# Patient Record
Sex: Male | Born: 1965 | Race: White | Hispanic: No | Marital: Married | Smoking: Never smoker
Health system: Southern US, Community
[De-identification: ages and names within clinical notes are randomized; demographics above are authoritative.]

## PROBLEM LIST (undated history)

## (undated) DIAGNOSIS — I2584 Coronary atherosclerosis due to calcified coronary lesion: Secondary | ICD-10-CM

## (undated) DIAGNOSIS — R0789 Other chest pain: Secondary | ICD-10-CM

## (undated) DIAGNOSIS — E559 Vitamin D deficiency, unspecified: Secondary | ICD-10-CM

## (undated) DIAGNOSIS — E785 Hyperlipidemia, unspecified: Secondary | ICD-10-CM

## (undated) DIAGNOSIS — I00 Rheumatic fever without heart involvement: Secondary | ICD-10-CM

## (undated) DIAGNOSIS — R0602 Shortness of breath: Secondary | ICD-10-CM

## (undated) DIAGNOSIS — K649 Unspecified hemorrhoids: Secondary | ICD-10-CM

## (undated) HISTORY — DX: Unspecified hemorrhoids: K64.9

## (undated) HISTORY — DX: Other chest pain: R07.89

## (undated) HISTORY — DX: Vitamin D deficiency, unspecified: E55.9

## (undated) HISTORY — DX: Coronary atherosclerosis due to calcified coronary lesion: I25.84

## (undated) HISTORY — DX: Shortness of breath: R06.02

## (undated) HISTORY — DX: Rheumatic fever without heart involvement: I00

## (undated) HISTORY — PX: URETHRA SURGERY: SHX824

## (undated) HISTORY — DX: Hyperlipidemia, unspecified: E78.5

---

## 1988-01-22 LAB — HM SIGMOIDOSCOPY

## 2004-01-24 LAB — FECAL OCCULT BLOOD, GUAIAC: Fecal Occult Blood: NEGATIVE

## 2010-04-10 ENCOUNTER — Encounter: Payer: Self-pay | Admitting: Family Medicine

## 2010-04-10 DIAGNOSIS — N400 Enlarged prostate without lower urinary tract symptoms: Secondary | ICD-10-CM | POA: Insufficient documentation

## 2010-04-10 DIAGNOSIS — I454 Nonspecific intraventricular block: Secondary | ICD-10-CM | POA: Insufficient documentation

## 2010-04-10 DIAGNOSIS — K649 Unspecified hemorrhoids: Secondary | ICD-10-CM | POA: Insufficient documentation

## 2010-04-10 DIAGNOSIS — I00 Rheumatic fever without heart involvement: Secondary | ICD-10-CM | POA: Insufficient documentation

## 2012-02-26 ENCOUNTER — Emergency Department (HOSPITAL_COMMUNITY): Payer: BC Managed Care – PPO

## 2012-02-26 ENCOUNTER — Observation Stay (HOSPITAL_COMMUNITY): Payer: BC Managed Care – PPO

## 2012-02-26 ENCOUNTER — Observation Stay (HOSPITAL_COMMUNITY)
Admission: EM | Admit: 2012-02-26 | Discharge: 2012-02-26 | Disposition: A | Discharge status: Home / Self Care | Payer: BC Managed Care – PPO | Attending: Emergency Medicine | Admitting: Emergency Medicine

## 2012-02-26 ENCOUNTER — Encounter (HOSPITAL_COMMUNITY): Payer: Self-pay | Admitting: *Deleted

## 2012-02-26 DIAGNOSIS — I454 Nonspecific intraventricular block: Secondary | ICD-10-CM | POA: Insufficient documentation

## 2012-02-26 DIAGNOSIS — I059 Rheumatic mitral valve disease, unspecified: Secondary | ICD-10-CM

## 2012-02-26 DIAGNOSIS — R110 Nausea: Secondary | ICD-10-CM | POA: Insufficient documentation

## 2012-02-26 DIAGNOSIS — R079 Chest pain, unspecified: Principal | ICD-10-CM | POA: Insufficient documentation

## 2012-02-26 DIAGNOSIS — R209 Unspecified disturbances of skin sensation: Secondary | ICD-10-CM | POA: Insufficient documentation

## 2012-02-26 LAB — POCT I-STAT, CHEM 8
Calcium, Ion: 1.25 mmol/L — ABNORMAL HIGH (ref 1.12–1.23)
Creatinine, Ser: 1.1 mg/dL (ref 0.50–1.35)
Glucose, Bld: 98 mg/dL (ref 70–99)
Hemoglobin: 15.3 g/dL (ref 13.0–17.0)
Sodium: 144 mEq/L (ref 135–145)
TCO2: 29 mmol/L (ref 0–100)

## 2012-02-26 LAB — POCT I-STAT TROPONIN I

## 2012-02-26 LAB — CBC
Hemoglobin: 15.2 g/dL (ref 13.0–17.0)
MCH: 31.1 pg (ref 26.0–34.0)
MCHC: 35.2 g/dL (ref 30.0–36.0)
MCV: 88.5 fL (ref 78.0–100.0)

## 2012-02-26 MED ORDER — NITROGLYCERIN 0.4 MG SL SUBL
SUBLINGUAL_TABLET | SUBLINGUAL | Status: AC
  Administered 2012-02-26: 0.4 mg via SUBLINGUAL
  Filled 2012-02-26: qty 25

## 2012-02-26 MED ORDER — NITROGLYCERIN 0.4 MG SL SUBL: 0.4000 mg | SUBLINGUAL_TABLET | SUBLINGUAL | Status: DC | PRN

## 2012-02-26 MED ORDER — NITROGLYCERIN 0.4 MG SL SUBL
0.4000 mg | SUBLINGUAL_TABLET | Freq: Once | SUBLINGUAL | Status: AC
  Administered 2012-02-26: 0.4 mg via SUBLINGUAL

## 2012-02-26 MED ORDER — ONDANSETRON HCL 4 MG/2ML IJ SOLN: 4.0000 mg | Freq: Once | INTRAMUSCULAR | Status: DC

## 2012-02-26 MED ORDER — METOPROLOL TARTRATE 1 MG/ML IV SOLN
INTRAVENOUS | Status: AC
  Administered 2012-02-26: 5 mg via INTRAVENOUS
  Filled 2012-02-26: qty 10

## 2012-02-26 MED ORDER — MORPHINE SULFATE 4 MG/ML IJ SOLN: 4.0000 mg | Freq: Once | INTRAMUSCULAR | Status: DC

## 2012-02-26 MED ORDER — IOHEXOL 350 MG/ML SOLN
80.0000 mL | Freq: Once | INTRAVENOUS | Status: AC | PRN
  Administered 2012-02-26: 80 mL via INTRAVENOUS

## 2012-02-26 MED ORDER — SODIUM CHLORIDE 0.9 % IV SOLN: 20.0000 mL | INTRAVENOUS | Status: DC

## 2012-02-26 MED ORDER — METOPROLOL TARTRATE 1 MG/ML IV SOLN
5.0000 mg | Freq: Once | INTRAVENOUS | Status: AC
  Administered 2012-02-26: 5 mg via INTRAVENOUS

## 2012-02-26 MED ORDER — HYDROMORPHONE HCL PF 1 MG/ML IJ SOLN: 1.0000 mg | INTRAMUSCULAR | Status: DC | PRN

## 2012-02-26 NOTE — ED Notes (Signed)
Pt states that he woke up at 03:30 with CP. Pt states that he was nausated at the time. Pt also complaining of leg heaviness. Pt states pain is left side of chest does not radiate. Pt states feels dull. Pt has had similar pain in past.

## 2012-02-26 NOTE — ED Notes (Signed)
CHECKED WITH DR Ignacia Palma AGAIN ABOUT DISPO ON PT. HE ADVISES HE WILL BE HERE AS SOON AS HE CAN

## 2012-02-26 NOTE — ED Notes (Signed)
MD at bedside. 

## 2012-02-26 NOTE — ED Notes (Signed)
Called ct. They will call when ready.

## 2012-02-26 NOTE — ED Notes (Signed)
Dr Ignacia Palma aware pt has resulted his studies. He will review results and dispo pt.

## 2012-02-26 NOTE — ED Provider Notes (Signed)
History     CSN: [id number]  Arrival date & time 02/26/12  0453   First MD Initiated Contact with Patient 02/26/12 0502      Chief Complaint  Patient presents with  . Chest Pain    (Consider location/radiation/quality/duration/timing/severity/associated sxs/prior treatment) HPI Hx per PT> woke today with L sided CP 5/10, dull in quality like I have been kicked in the chest.  Some nausea no vomiting. No sig SOB, no F/C, no cough, no leg pain or swelling. No h/o same. Has some L arm tingling, no arm pain or radiation of pain, no known aggrevating or alleviating factors.  Past Medical History  Diagnosis Date  . 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 Unspecified hemorrhoids without mention of complication   . Bundle branch block, unspecified   . Hyperplasia of prostate     No past surgical history on file.  No family history on file.  History  Substance Use Topics  . Smoking status: Not on file  . Smokeless tobacco: Not on file  . Alcohol Use: Not on file      Review of Systems  Constitutional: Negative for fever and chills.  HENT: Negative for neck pain and neck stiffness.   Eyes: Negative for pain.  Respiratory: Negative for shortness of breath.   Cardiovascular: Positive for chest pain.  Gastrointestinal: Negative for abdominal pain.  Genitourinary: Negative for dysuria.  Musculoskeletal: Negative for back pain.  Skin: Negative for rash.  Neurological: Negative for headaches.  All other systems reviewed and are negative.    Allergies  Simvastatin and Lipitor  Home Medications   Current Outpatient Rx  Name  Route  Sig  Dispense  Refill  . NIACIN ER (ANTIHYPERLIPIDEMIC) 500 MG PO TBCR   Oral   Take 500 mg by mouth at bedtime.           . OMEGA-3-ACID ETHYL ESTERS 1 G PO CAPS   Oral   Take 1 g by mouth 2 (two) times daily.           Marland Kitchen ROSUVASTATIN CALCIUM 5 MG PO TABS   Oral   Take 5 mg by mouth daily.           Marland Kitchen VARDENAFIL  HCL 10 MG PO TABS   Oral   Take 10 mg by mouth daily as needed.             There were no vitals taken for this visit.  Physical Exam  Constitutional: He is oriented to person, place, and time. He appears well-developed and well-nourished.  HENT:  Head: Normocephalic and atraumatic.  Eyes: Conjunctivae normal and EOM are normal. Pupils are equal, round, and reactive to light.  Neck: Trachea normal. Neck supple. No thyromegaly present.  Cardiovascular: Normal rate, regular rhythm, S1 normal, S2 normal and normal pulses.   Pulses:      Radial pulses are 2+ on the right side, and 2+ on the left side.  Pulmonary/Chest: Effort normal and breath sounds normal. He has no wheezes. He has no rhonchi. He has no rales. He exhibits no tenderness.  Abdominal: Soft. Normal appearance and bowel sounds are normal. There is no tenderness. There is no CVA tenderness and negative Murphy's sign.  Musculoskeletal:       BLE:s Calves nontender, no cords or erythema, negative Homans sign  Neurological: He is alert and oriented to person, place, and time. He has normal strength. No cranial nerve deficit or sensory deficit. GCS eye  subscore is 4. GCS verbal subscore is 5. GCS motor subscore is 6.  Skin: Skin is warm and dry. No rash noted. He is not diaphoretic.  Psychiatric: His speech is normal.       Cooperative and appropriate    ED Course  Procedures (including critical care time)  Results for orders placed during the hospital encounter of 02/26/12  CBC      Component Value Range   WBC 5.1  4.0 - 10.5 K/uL   RBC 4.88  4.22 - 5.81 MIL/uL   Hemoglobin 15.2  13.0 - 17.0 g/dL   HCT 16.1  09.6 - 04.5 %   MCV 88.5  78.0 - 100.0 fL   MCH 31.1  26.0 - 34.0 pg   MCHC 35.2  30.0 - 36.0 g/dL   RDW 40.9  81.1 - 91.4 %   Platelets 144 (*) 150 - 400 K/uL  POCT I-STAT, CHEM 8      Component Value Range   Sodium 144  135 - 145 mEq/L   Potassium 4.3  3.5 - 5.1 mEq/L   Chloride 105  96 - 112 mEq/L   BUN  11  6 - 23 mg/dL   Creatinine, Ser 7.82  0.50 - 1.35 mg/dL   Glucose, Bld 98  70 - 99 mg/dL   Calcium, Ion 9.56 (*) 1.12 - 1.23 mmol/L   TCO2 29  0 - 100 mmol/L   Hemoglobin 15.3  13.0 - 17.0 g/dL   HCT 21.3  08.6 - 57.8 %  POCT I-STAT TROPONIN I      Component Value Range   Troponin i, poc 0.00  0.00 - 0.08 ng/mL   Comment 3            Dg Chest Portable 1 View  02/26/2012  *RADIOLOGY REPORT*  Clinical Data: Left chest pain and shortness of breath.  PORTABLE CHEST - 1 VIEW  Comparison: None.  Findings: Mild hyperinflation. The heart size and pulmonary vascularity are normal. The lungs appear clear and expanded without focal air space disease or consolidation. No blunting of the costophrenic angles. No pneumothorax.  Mediastinal contours appear intact.  IMPRESSION: Mild hyperinflation.  No evidence of active infiltration.   Original Report Authenticated By: Burman Nieves, M.D.      Date: 02/26/2012  Rate: 73  Rhythm: normal sinus rhythm  QRS Axis: normal  Intervals: normal  ST/T Wave abnormalities: nonspecific ST changes  Conduction Disutrbances:none  Narrative Interpretation:   Old EKG Reviewed: none available  ASA PTA NTG - no sig change in pain IV morphine pain control  MDM  CP low risk for ACS - no FH early CAD.   evaluated with ECG, labs and CXR  Plan CDU for coronary CT scan to further evaluate - DR Ignacia Palma to follow      Sunnie Nielsen, MD 02/26/12 2308

## 2012-02-26 NOTE — ED Notes (Signed)
Portable CXR at bedside.

## 2012-02-26 NOTE — Progress Notes (Signed)
  Echocardiogram 2D Echocardiogram has been performed.  Cathie Beams 02/26/2012, 8:47 AM

## 2012-02-26 NOTE — Progress Notes (Signed)
1:10 PM Coronary CT showed 20% stenosis in LAD.  Echocardiogram essentially normal.  Will refer for followup to Dr. Eldridge Dace, cardiologist on call today.  Reassured and released. 1:40 PM Case discussed with Dr. Eldridge Dace, and his office will call him with an appointment time

## 2012-02-26 NOTE — ED Notes (Signed)
Dr Ignacia Palma in to give pt results of his testing. Pt given a [place] sandwich and one to his wife.

## 2012-02-26 NOTE — ED Notes (Signed)
ECHO ORDERED DUE TO PT VERBALIZING CONCERN ABOUT HIS "MURMER" POSSIBLY BEING A CAUSE OF HIS CHEST PAINS.

## 2012-02-26 NOTE — ED Notes (Signed)
BMI IS 26.6

## 2012-02-27 NOTE — Progress Notes (Signed)
UR completed OBS

## 2012-03-07 ENCOUNTER — Other Ambulatory Visit: Payer: Self-pay

## 2012-03-21 DIAGNOSIS — R0789 Other chest pain: Secondary | ICD-10-CM

## 2012-03-21 DIAGNOSIS — R0602 Shortness of breath: Secondary | ICD-10-CM

## 2012-03-21 HISTORY — DX: Shortness of breath: R06.02

## 2012-03-21 HISTORY — DX: Other chest pain: R07.89

## 2012-04-30 ENCOUNTER — Other Ambulatory Visit: Payer: Self-pay | Admitting: Family Medicine

## 2012-05-01 NOTE — Telephone Encounter (Signed)
Patient appointment for visit and lab

## 2012-05-01 NOTE — Telephone Encounter (Signed)
Patient last seen in office on 10-30-11. Last labs done same day. Please advise. Thank you

## 2012-05-04 ENCOUNTER — Encounter: Payer: Self-pay | Admitting: Family Medicine

## 2012-05-04 ENCOUNTER — Ambulatory Visit (INDEPENDENT_AMBULATORY_CARE_PROVIDER_SITE_OTHER): Payer: BC Managed Care – PPO | Admitting: Family Medicine

## 2012-05-04 VITALS — BP 112/66 | HR 62 | Temp 97.2°F | Ht 70.0 in | Wt 187.4 lb

## 2012-05-04 DIAGNOSIS — Z1212 Encounter for screening for malignant neoplasm of rectum: Secondary | ICD-10-CM

## 2012-05-04 DIAGNOSIS — L918 Other hypertrophic disorders of the skin: Secondary | ICD-10-CM

## 2012-05-04 DIAGNOSIS — N529 Male erectile dysfunction, unspecified: Secondary | ICD-10-CM | POA: Insufficient documentation

## 2012-05-04 DIAGNOSIS — Z139 Encounter for screening, unspecified: Secondary | ICD-10-CM | POA: Insufficient documentation

## 2012-05-04 DIAGNOSIS — E785 Hyperlipidemia, unspecified: Secondary | ICD-10-CM

## 2012-05-04 DIAGNOSIS — L919 Hypertrophic disorder of the skin, unspecified: Secondary | ICD-10-CM

## 2012-05-04 LAB — POCT CBC
MCH, POC: 32.2 pg — AB (ref 27–31.2)
MCV: 92.1 fL (ref 80–97)
POC LYMPH PERCENT: 34 %L (ref 10–50)
Platelet Count, POC: 131 10*3/uL — AB (ref 142–424)
RDW, POC: 12.5 %
WBC: 4.5 10*3/uL — AB (ref 4.6–10.2)

## 2012-05-04 LAB — BASIC METABOLIC PANEL WITH GFR
BUN: 18 mg/dL (ref 6–23)
Chloride: 108 mEq/L (ref 96–112)
Creat: 1.14 mg/dL (ref 0.50–1.35)
GFR, Est African American: 87 mL/min
GFR, Est Non African American: 76 mL/min

## 2012-05-04 LAB — HEPATIC FUNCTION PANEL
ALT: 17 U/L (ref 0–53)
Albumin: 4.3 g/dL (ref 3.5–5.2)
Alkaline Phosphatase: 62 U/L (ref 39–117)
Total Protein: 6.5 g/dL (ref 6.0–8.3)

## 2012-05-04 MED ORDER — VARDENAFIL HCL 10 MG PO TBDP: 1.0000 | ORAL_TABLET | Freq: Every day | ORAL | Status: DC | PRN

## 2012-05-04 NOTE — Addendum Note (Signed)
Addended by: Orma Render F on: 05/04/2012 10:59 AM   Modules accepted: Orders

## 2012-05-04 NOTE — Patient Instructions (Addendum)
Continue current meds and therapeutic lifestyle changes Clean axillary area gently, use antibiotic ointment if necessary

## 2012-05-04 NOTE — Addendum Note (Signed)
Addended by: Bearl Mulberry on: 05/04/2012 10:36 AM   Modules accepted: Orders

## 2012-05-04 NOTE — Progress Notes (Signed)
  Subjective:    Patient ID: Kyle Andrade, male    DOB: 07-Oct-1965, 47 y.o.   MRN: [id number]  HPI  Kyle Andrade comes into day for regular followup of his ongoing problems.  As of note he had a visit to the emergency room in February for chest pain. This was worked up with an EKG, echocardiogram, and chest cardiac CT. The workup basically showed minimal atherosclerosis. A chest x-ray was done which was negative. BMP and CBC were done during the visit to the emergency room and these were essentially within normal limits. Health maintenance is up-to-date.  Review of Systems  Constitutional: Negative.   HENT: Negative.   Eyes: Negative.   Respiratory: Negative.   Cardiovascular: Negative.   Gastrointestinal: Negative.   Endocrine: Negative.   Genitourinary: Negative.   Musculoskeletal: Negative.   Allergic/Immunologic: Positive for environmental allergies.  Neurological: Negative.   Psychiatric/Behavioral: Negative.        Objective:   Physical Exam  BP 112/66  Pulse 62  Temp(Src) 97.2 F (36.2 C) (Oral)  Ht 5\' 10"  (1.778 m)  Wt 187 lb 6.4 oz (85.004 kg)  BMI 26.89 kg/m2  The patient appeared well nourished and normally developed, alert and oriented to time and place. Speech, behavior and judgement appear normal. Vital signs as documented.  Head exam is unremarkable. No scleral icterus or pallor noted. Nasal congestion bilaterally left greater than right, throat was normal.  Neck is without jugular venous distension, thyromegally, or carotid bruits. Carotid upstrokes are brisk bilaterally. No cervical adenopathy. Lungs are clear anteriorly and posteriorly to auscultation. Normal respiratory effort. Cardiac exam reveals regular rate and rhythm. First and second heart sounds normal. No murmurs, rubs or gallops.  Abdominal exam reveals normal bowl sounds, no masses, no organomegaly and no aortic enlargement. No inguinal adenopathy. Extremities are nonedematous and both femoral and  pedal pulses are normal. Skin without pallor or jaundice.  Warm and dry, without rash. One skin tag right axilla, probably removed under sterile technique. Neurologic exam reveals normal deep tendon reflexes and normal sensation.         Assessment & Plan:  1. Other and unspecified hyperlipidemia - BASIC METABOLIC PANEL WITH GFR - Hepatic function panel - NMR Lipoprofile with Lipids  2. Screening - POCT CBC - Vitamin D 25 hydroxy  3. Inflamed skin tag Axillary skin tag removed without problem  4. Erectile dysfunction Samples of Cialis 5 were given for the patient to try. Also prescription for staxyn  was called in to [facility] in [place]

## 2012-05-05 LAB — VITAMIN D 25 HYDROXY (VIT D DEFICIENCY, FRACTURES): Vit D, 25-Hydroxy: 47 ng/mL (ref 30–89)

## 2012-05-06 LAB — NMR LIPOPROFILE WITH LIPIDS
HDL Size: 8.7 nm — ABNORMAL LOW (ref >=9.2)
HDL-C: 37 mg/dL — ABNORMAL LOW (ref >=40)
LDL Size: 20.6 nm (ref >20.5)
Large HDL-P: 2.5 umol/L — ABNORMAL LOW (ref >=4.8)
Small LDL Particle Number: 448 nmol/L (ref <=527)

## 2012-11-02 ENCOUNTER — Ambulatory Visit (INDEPENDENT_AMBULATORY_CARE_PROVIDER_SITE_OTHER): Payer: BC Managed Care – PPO | Admitting: Family Medicine

## 2012-11-02 ENCOUNTER — Encounter: Payer: Self-pay | Admitting: Family Medicine

## 2012-11-02 VITALS — BP 114/73 | HR 60 | Temp 97.0°F | Ht 70.0 in | Wt 182.0 lb

## 2012-11-02 DIAGNOSIS — E559 Vitamin D deficiency, unspecified: Secondary | ICD-10-CM

## 2012-11-02 DIAGNOSIS — E785 Hyperlipidemia, unspecified: Secondary | ICD-10-CM

## 2012-11-02 DIAGNOSIS — Z23 Encounter for immunization: Secondary | ICD-10-CM

## 2012-11-02 DIAGNOSIS — Z0000 Encounter for general adult medical examination without abnormal findings: Secondary | ICD-10-CM

## 2012-11-02 DIAGNOSIS — N400 Enlarged prostate without lower urinary tract symptoms: Secondary | ICD-10-CM

## 2012-11-02 LAB — POCT UA - MICROSCOPIC ONLY
Casts, Ur, LPF, POC: NEGATIVE
Mucus, UA: NEGATIVE
WBC, Ur, HPF, POC: NEGATIVE
Yeast, UA: NEGATIVE

## 2012-11-02 LAB — POCT URINALYSIS DIPSTICK
Ketones, UA: NEGATIVE
Protein, UA: NEGATIVE
Spec Grav, UA: 1.01
Urobilinogen, UA: NEGATIVE
pH, UA: 6.5

## 2012-11-02 LAB — POCT CBC
Granulocyte percent: 59.1 %G (ref 37–80)
MPV: 8.2 fL (ref 0–99.8)
POC Granulocyte: 2.5 (ref 2–6.9)
POC LYMPH PERCENT: 37.7 %L (ref 10–50)
Platelet Count, POC: 139 10*3/uL — AB (ref 142–424)
RDW, POC: 11.9 %

## 2012-11-02 NOTE — Progress Notes (Signed)
Subjective:    Patient ID: Kyle Andrade, male    DOB: August 24, 1965, 47 y.o.   MRN: [id number]  HPI Pt here for follow up and management of chronic medical problems. Patient is in today for his annual exam. See previous note as he was in the hospital for a chest pain workup back in February and everything turned out well.     Patient Active Problem List   Diagnosis Date Noted  . Other and unspecified hyperlipidemia 05/04/2012  . Screening 05/04/2012  . Erectile dysfunction 05/04/2012  . 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 Unspecified hemorrhoids without mention of complication   . Bundle branch block, unspecified   . Hyperplasia of prostate    Outpatient Encounter Prescriptions as of 11/02/2012  Medication Sig Dispense Refill  . aspirin 81 MG chewable tablet Chew 81 mg by mouth daily.      . Cholecalciferol (VITAMIN D) 2000 UNITS CAPS Take 1 capsule by mouth daily.      . CRESTOR 10 MG tablet TAKE (1) TABLET DAILY AS DIRECTED.  30 tablet  3  . fluticasone (FLONASE) 50 MCG/ACT nasal spray Place 2 sprays into the nose daily.      . niacin (NIASPAN) 500 MG CR tablet Take 500 mg by mouth at bedtime.        Marland Kitchen omega-3 acid ethyl esters (LOVAZA) 1 G capsule Take 2 g by mouth daily.       . Vardenafil HCl 10 MG TBDP Take 1 tablet by mouth daily as needed.  6 tablet  1   No facility-administered encounter medications on file as of 11/02/2012.    Review of Systems  Constitutional: Negative.   HENT: Negative.   Eyes: Negative.   Respiratory: Negative.   Cardiovascular: Negative.   Gastrointestinal: Negative.   Endocrine: Negative.   Genitourinary: Negative.   Musculoskeletal: Negative.   Skin: Negative.   Allergic/Immunologic: Negative.   Neurological: Negative.   Hematological: Negative.   Psychiatric/Behavioral: Negative.        Objective:   Physical Exam  Nursing note and vitals reviewed. Constitutional: He is oriented to person, place, and time. He  appears well-developed and well-nourished.  HENT:  Head: Normocephalic and atraumatic.  Right Ear: External ear normal.  Left Ear: External ear normal.  Nose: Nose normal.  Mouth/Throat: Oropharynx is clear and moist. No oropharyngeal exudate.  Slight nasal congestion on the left side  Eyes: Conjunctivae and EOM are normal. Pupils are equal, round, and reactive to light. Right eye exhibits no discharge. Left eye exhibits no discharge. No scleral icterus.  Neck: Normal range of motion. Neck supple. No tracheal deviation present. No thyromegaly present.  No bruits in the neck  Cardiovascular: Normal rate, regular rhythm, normal heart sounds and intact distal pulses.  Exam reveals no gallop and no friction rub.   No murmur heard. At 72 per minute  Pulmonary/Chest: Effort normal and breath sounds normal. No respiratory distress. He has no wheezes. He has no rales. He exhibits no tenderness.  Abdominal: Soft. Bowel sounds are normal. He exhibits no mass. There is no tenderness. There is no rebound and no guarding.  Genitourinary: Rectum normal, prostate normal and penis normal.  No rectal masses, no hernia, minimal if any enlargement of the prostate. No prostate nodules  Musculoskeletal: Normal range of motion. He exhibits no edema and no tenderness.  Lymphadenopathy:    He has no cervical adenopathy.  Neurological: He is alert and oriented to person, place,  and time. He has normal reflexes. No cranial nerve deficit.  Skin: Skin is warm and dry. No rash noted. No erythema. No pallor.  Psychiatric: He has a normal mood and affect. His behavior is normal. Judgment and thought content normal.   BP 114/73  Pulse 60  Temp(Src) 97 F (36.1 C) (Oral)  Ht 5\' 10"  (1.778 m)  Wt 182 lb (82.555 kg)  BMI 26.11 kg/m2        Assessment & Plan:   1. Annual physical exam   2. Other and unspecified hyperlipidemia   3. BPH (benign prostatic hyperplasia)   4. Vitamin D deficiency    Orders Placed  This Encounter  Procedures  . Hepatic function panel  . BMP8+EGFR  . NMR, lipoprofile  . PSA, total and free  . Testosterone,Free and Total  . Vit D  25 hydroxy (rtn osteoporosis monitoring)  . POCT CBC  . POCT urinalysis dipstick  . POCT UA - Microscopic Only   Patient Instructions  Continue current medications. Continue good therapeutic lifestyle changes.  Fall precautions discussed with patient. Follow up as planned and earlier as needed.  At age 57 you will need to get a colonoscopy, a Prevnar shot, and a shingle shot. Keep your eye exam every 2 years    Nyra Capes MD

## 2012-11-02 NOTE — Patient Instructions (Addendum)
Continue current medications. Continue good therapeutic lifestyle changes.  Fall precautions discussed with patient. Follow up as planned and earlier as needed.  At age 47 you will need to get a colonoscopy, a Prevnar shot, and a shingle shot. Keep your eye exam every 2 years

## 2012-11-03 LAB — TESTOSTERONE,FREE AND TOTAL: Testosterone: 438 ng/dL (ref 348–1197)

## 2012-11-03 LAB — BMP8+EGFR
BUN/Creatinine Ratio: 13 (ref 9–20)
BUN: 14 mg/dL (ref 6–24)
Creatinine, Ser: 1.07 mg/dL (ref 0.76–1.27)
GFR calc Af Amer: 94 mL/min/{1.73_m2} (ref >59)
Glucose: 94 mg/dL (ref 65–99)

## 2012-11-03 LAB — HEPATIC FUNCTION PANEL
Albumin: 5 g/dL (ref 3.5–5.5)
Total Protein: 6.5 g/dL (ref 6.0–8.5)

## 2012-11-03 LAB — VITAMIN D 25 HYDROXY (VIT D DEFICIENCY, FRACTURES): Vit D, 25-Hydroxy: 54.4 ng/mL (ref 30.0–100.0)

## 2012-11-03 LAB — PSA, TOTAL AND FREE
PSA, Free Pct: 40 %
PSA: 0.7 ng/mL (ref 0.0–4.0)

## 2012-11-03 LAB — NMR, LIPOPROFILE
Cholesterol: 126 mg/dL (ref <200)
HDL Cholesterol by NMR: 38 mg/dL — ABNORMAL LOW (ref >=40)
HDL Particle Number: 27.8 umol/L — ABNORMAL LOW (ref >=30.5)
LDL Particle Number: 929 nmol/L (ref <1000)
LDLC SERPL CALC-MCNC: 69 mg/dL (ref <100)

## 2012-12-11 ENCOUNTER — Encounter: Payer: Self-pay | Admitting: Interventional Cardiology

## 2013-03-30 ENCOUNTER — Ambulatory Visit: Payer: BC Managed Care – PPO | Admitting: Interventional Cardiology

## 2013-04-22 ENCOUNTER — Ambulatory Visit: Payer: BC Managed Care – PPO | Admitting: Interventional Cardiology

## 2013-05-05 ENCOUNTER — Ambulatory Visit (INDEPENDENT_AMBULATORY_CARE_PROVIDER_SITE_OTHER): Payer: BC Managed Care – PPO | Admitting: Family Medicine

## 2013-05-05 ENCOUNTER — Encounter: Payer: Self-pay | Admitting: Family Medicine

## 2013-05-05 VITALS — BP 130/75 | HR 61 | Temp 97.7°F | Ht 70.0 in | Wt 183.0 lb

## 2013-05-05 DIAGNOSIS — N529 Male erectile dysfunction, unspecified: Secondary | ICD-10-CM

## 2013-05-05 DIAGNOSIS — E785 Hyperlipidemia, unspecified: Secondary | ICD-10-CM

## 2013-05-05 DIAGNOSIS — Z1212 Encounter for screening for malignant neoplasm of rectum: Secondary | ICD-10-CM

## 2013-05-05 DIAGNOSIS — E559 Vitamin D deficiency, unspecified: Secondary | ICD-10-CM

## 2013-05-05 LAB — POCT CBC
GRANULOCYTE PERCENT: 66.2 % (ref 37–80)
HCT, POC: 52.7 % (ref 43.5–53.7)
Hemoglobin: 17.1 g/dL (ref 14.1–18.1)
LYMPH, POC: 1.2 (ref 0.6–3.4)
MCH, POC: 30.2 pg (ref 27–31.2)
MCHC: 32.4 g/dL (ref 31.8–35.4)
MCV: 93.1 fL (ref 80–97)
MPV: 8.3 fL (ref 0–99.8)
PLATELET COUNT, POC: 90 10*3/uL — AB (ref 142–424)
POC GRANULOCYTE: 2.4 (ref 2–6.9)
POC LYMPH PERCENT: 32 %L (ref 10–50)
RBC: 5.7 M/uL (ref 4.69–6.13)
RDW, POC: 12.2 %
WBC: 3.6 10*3/uL — AB (ref 4.6–10.2)

## 2013-05-05 MED ORDER — CRESTOR 10 MG PO TABS: ORAL_TABLET | ORAL | Status: DC

## 2013-05-05 NOTE — Patient Instructions (Addendum)
Continue current medications. Continue good therapeutic lifestyle changes which include good diet and exercise. Fall precautions discussed with patient. If an FOBT was given today- please return it to our front desk. Be sure and check with your insurance regarding the shingles vaccine and Prevnar vaccine which is recommended at age 50 Check your body closely for tics Drink plenty of fluids Return the FOBT We will call you with the results of the lab work once those results are available

## 2013-05-05 NOTE — Progress Notes (Signed)
 Subjective:    Patient ID: Kyle Andrade, male    DOB: 12/21/1965, 47 y.o.   MRN: [id number]  HPI Pt here for follow up and management of chronic medical problems. The patient has been doing well with no particular complaints or problems. Has a history of hyperlipidemia and erectile dysfunction. His family history is stable his father has COPD because of a history of smoking.       Patient Active Problem List   Diagnosis Date Noted  . Hyperlipidemia 05/04/2012  . Screening 05/04/2012  . Erectile dysfunction 05/04/2012  . 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 Unspecified hemorrhoids without mention of complication   . Bundle branch block, unspecified   . Hyperplasia of prostate    Outpatient Encounter Prescriptions as of 05/05/2013  Medication Sig  . aspirin 81 MG chewable tablet Chew 81 mg by mouth daily.  . Azelastine HCl 0.15 % SOLN Place 1 spray into the nose at bedtime.   . Cholecalciferol (VITAMIN D) 2000 UNITS CAPS Take 1 capsule by mouth daily.  . CRESTOR 10 MG tablet TAKE (1) TABLET DAILY AS DIRECTED.  . fluticasone (FLONASE) 50 MCG/ACT nasal spray Place 2 sprays into the nose daily.  . niacin (NIASPAN) 500 MG CR tablet Take 500 mg by mouth at bedtime.    . omega-3 acid ethyl esters (LOVAZA) 1 G capsule Take 2 g by mouth daily.   . Vardenafil HCl 10 MG TBDP Take 1 tablet by mouth daily as needed.    Review of Systems  Constitutional: Negative.   HENT: Negative.   Eyes: Negative.   Respiratory: Negative.   Cardiovascular: Negative.   Gastrointestinal: Negative.   Endocrine: Negative.   Genitourinary: Negative.   Musculoskeletal: Negative.   Skin: Negative.   Allergic/Immunologic: Negative.   Neurological: Negative.   Hematological: Negative.   Psychiatric/Behavioral: Negative.        Objective:   Physical Exam  Nursing note and vitals reviewed. Constitutional: He is oriented to person, place, and time. He appears well-developed and  well-nourished. No distress.  HENT:  Head: Normocephalic and atraumatic.  Right Ear: External ear normal.  Left Ear: External ear normal.  Nose: Nose normal.  Mouth/Throat: Oropharynx is clear and moist. No oropharyngeal exudate.  Minimal nasal congestion  Eyes: Conjunctivae and EOM are normal. Pupils are equal, round, and reactive to light. Right eye exhibits no discharge. Left eye exhibits no discharge. No scleral icterus.  Neck: Normal range of motion. Neck supple. No thyromegaly present.  No carotid bruits  Cardiovascular: Normal rate, regular rhythm, normal heart sounds and intact distal pulses.   No murmur heard. At 72 per minute  Pulmonary/Chest: Effort normal and breath sounds normal. No respiratory distress. He has no wheezes. He has no rales.  No axillary adenopathy  Abdominal: Soft. Bowel sounds are normal. He exhibits no mass. There is no tenderness. There is no rebound and no guarding.  No inguinal adenopathy  Genitourinary: Prostate normal.  Musculoskeletal: Normal range of motion. He exhibits no edema.  Lymphadenopathy:    He has no cervical adenopathy.  Neurological: He is alert and oriented to person, place, and time. He has normal reflexes.  Skin: Skin is warm and dry. No rash noted.  Psychiatric: He has a normal mood and affect. His behavior is normal. Judgment and thought content normal.   BP 130/75  Pulse 61  Temp(Src) 97.7 F (36.5 C) (Oral)  Ht 5' 10" (1.778 m)  Wt 183 lb (83.008   kg)  BMI 26.26 kg/m2        Assessment & Plan:  1. Hyperlipidemia - POCT CBC - BMP8+EGFR - Hepatic function panel - NMR, lipoprofile  2. Vitamin D deficiency - POCT CBC - Vit D  25 hydroxy (rtn osteoporosis monitoring)  3. Erectile dysfunction  4. Screening for malignant neoplasm of the rectum  5. Allergic rhinitis -Continue Nasacort and Astelin  Meds ordered this encounter  Medications  . Azelastine HCl 0.15 % SOLN    Sig: Place 1 spray into the nose at  bedtime.   . CRESTOR 10 MG tablet    Sig: TAKE (1) TABLET DAILY AS DIRECTED.    Dispense:  90 tablet    Refill:  0      Patient Instructions  Continue current medications. Continue good therapeutic lifestyle changes which include good diet and exercise. Fall precautions discussed with patient. If an FOBT was given today- please return it to our front desk. Be sure and check with your insurance regarding the shingles vaccine and Prevnar vaccine which is recommended at age 72 Check your body closely for tics Drink plenty of fluids Return the FOBT We will call you with the results of the lab work once those results are available   Arrie Senate MD

## 2013-05-06 LAB — NMR, LIPOPROFILE
CHOLESTEROL: 135 mg/dL (ref <200)
HDL CHOLESTEROL BY NMR: 41 mg/dL (ref >=40)
HDL PARTICLE NUMBER: 27.9 umol/L — AB (ref >=30.5)
LDL Particle Number: 1018 nmol/L — ABNORMAL HIGH (ref <1000)
LDL Size: 20.8 nm (ref >20.5)
LDLC SERPL CALC-MCNC: 80 mg/dL (ref <100)
LP-IR Score: 47 — ABNORMAL HIGH (ref <=45)
Small LDL Particle Number: 413 nmol/L (ref <=527)
Triglycerides by NMR: 68 mg/dL (ref <150)

## 2013-05-06 LAB — HEPATIC FUNCTION PANEL
ALBUMIN: 4.5 g/dL (ref 3.5–5.5)
ALT: 21 IU/L (ref 0–44)
AST: 9 IU/L (ref 0–40)
Alkaline Phosphatase: 61 IU/L (ref 39–117)
Bilirubin, Direct: 0.12 mg/dL (ref 0.00–0.40)
TOTAL PROTEIN: 6.6 g/dL (ref 6.0–8.5)
Total Bilirubin: 0.4 mg/dL (ref 0.0–1.2)

## 2013-05-06 LAB — BMP8+EGFR
BUN/Creatinine Ratio: 12 (ref 9–20)
BUN: 13 mg/dL (ref 6–24)
CHLORIDE: 105 mmol/L (ref 97–108)
CO2: 25 mmol/L (ref 18–29)
Calcium: 9.3 mg/dL (ref 8.7–10.2)
Creatinine, Ser: 1.08 mg/dL (ref 0.76–1.27)
GFR calc Af Amer: 93 mL/min/{1.73_m2} (ref >59)
GFR calc non Af Amer: 80 mL/min/{1.73_m2} (ref >59)
GLUCOSE: 88 mg/dL (ref 65–99)
Potassium: 4.2 mmol/L (ref 3.5–5.2)
Sodium: 144 mmol/L (ref 134–144)

## 2013-05-06 LAB — VITAMIN D 25 HYDROXY (VIT D DEFICIENCY, FRACTURES): VIT D 25 HYDROXY: 48.2 ng/mL (ref 30.0–100.0)

## 2013-05-14 ENCOUNTER — Other Ambulatory Visit: Payer: Self-pay | Admitting: Family Medicine

## 2013-10-18 ENCOUNTER — Other Ambulatory Visit: Payer: Self-pay | Admitting: Family Medicine

## 2013-11-04 ENCOUNTER — Ambulatory Visit: Payer: BC Managed Care – PPO | Admitting: Family Medicine

## 2013-11-05 ENCOUNTER — Other Ambulatory Visit: Payer: Self-pay

## 2013-11-08 ENCOUNTER — Ambulatory Visit (INDEPENDENT_AMBULATORY_CARE_PROVIDER_SITE_OTHER): Payer: BC Managed Care – PPO | Admitting: Family Medicine

## 2013-11-08 ENCOUNTER — Encounter: Payer: Self-pay | Admitting: Family Medicine

## 2013-11-08 VITALS — BP 114/78 | HR 67 | Temp 97.3°F | Ht 70.0 in | Wt 187.0 lb

## 2013-11-08 DIAGNOSIS — J301 Allergic rhinitis due to pollen: Secondary | ICD-10-CM

## 2013-11-08 DIAGNOSIS — N400 Enlarged prostate without lower urinary tract symptoms: Secondary | ICD-10-CM

## 2013-11-08 DIAGNOSIS — Z23 Encounter for immunization: Secondary | ICD-10-CM

## 2013-11-08 DIAGNOSIS — K4090 Unilateral inguinal hernia, without obstruction or gangrene, not specified as recurrent: Secondary | ICD-10-CM

## 2013-11-08 DIAGNOSIS — Z0000 Encounter for general adult medical examination without abnormal findings: Secondary | ICD-10-CM

## 2013-11-08 DIAGNOSIS — Z1211 Encounter for screening for malignant neoplasm of colon: Secondary | ICD-10-CM

## 2013-11-08 DIAGNOSIS — E785 Hyperlipidemia, unspecified: Secondary | ICD-10-CM

## 2013-11-08 DIAGNOSIS — K640 First degree hemorrhoids: Secondary | ICD-10-CM | POA: Insufficient documentation

## 2013-11-08 DIAGNOSIS — N5201 Erectile dysfunction due to arterial insufficiency: Secondary | ICD-10-CM

## 2013-11-08 DIAGNOSIS — J309 Allergic rhinitis, unspecified: Secondary | ICD-10-CM | POA: Insufficient documentation

## 2013-11-08 HISTORY — DX: Unilateral inguinal hernia, without obstruction or gangrene, not specified as recurrent: K40.90

## 2013-11-08 LAB — POCT URINALYSIS DIPSTICK
BILIRUBIN UA: NEGATIVE
GLUCOSE UA: NEGATIVE
Ketones, UA: NEGATIVE
LEUKOCYTES UA: NEGATIVE
NITRITE UA: NEGATIVE
Protein, UA: NEGATIVE
RBC UA: NEGATIVE
Spec Grav, UA: <=1.005
UROBILINOGEN UA: NEGATIVE
pH, UA: 7.5

## 2013-11-08 LAB — POCT UA - MICROSCOPIC ONLY
Bacteria, U Microscopic: NEGATIVE
Casts, Ur, LPF, POC: NEGATIVE
Crystals, Ur, HPF, POC: NEGATIVE
MUCUS UA: NEGATIVE
RBC, urine, microscopic: NEGATIVE
YEAST UA: NEGATIVE

## 2013-11-08 LAB — POCT CBC
Granulocyte percent: 62 %G (ref 37–80)
HCT, POC: 45.9 % (ref 43.5–53.7)
HEMOGLOBIN: 14.9 g/dL (ref 14.1–18.1)
LYMPH, POC: 1.4 (ref 0.6–3.4)
MCH, POC: 29.9 pg (ref 27–31.2)
MCHC: 32.5 g/dL (ref 31.8–35.4)
MCV: 92 fL (ref 80–97)
MPV: 8.1 fL (ref 0–99.8)
PLATELET COUNT, POC: 148 10*3/uL (ref 142–424)
POC GRANULOCYTE: 2.4 (ref 2–6.9)
POC LYMPH %: 35.5 % (ref 10–50)
RBC: 5 M/uL (ref 4.69–6.13)
RDW, POC: 12.3 %
WBC: 3.9 10*3/uL — AB (ref 4.6–10.2)

## 2013-11-08 NOTE — Patient Instructions (Addendum)
Continue current medications. Continue good therapeutic lifestyle changes which include good diet and exercise. Fall precautions discussed with patient. If an FOBT was given today- please return it to our front desk. If you are over 50 years old - you may need Prevnar 13 or the adult Pneumonia vaccine.  Flu Shots will be available at our office starting mid- September. Please call and schedule a FLU CLINIC APPOINTMENT.   Continue to use Cialis as directed Continue to use nasal sprays and saline nose spray Continue to drink plenty of fluids Check with your insurance as noted above regarding the Prevnar vaccine and the shingles shot We will arrange for you to get a colonoscopy

## 2013-11-08 NOTE — Progress Notes (Signed)
Subjective:    Patient ID: Kyle Andrade, male    DOB: 22-Aug-1965, 48 y.o.   MRN: [id number]  HPI Patient is here today for annual wellness exam and follow up of chronic medical problems. The patient is having no specific complaints. He continued to a Prevnar vaccine and shingles shot. He is also due to get his colonoscopy. The patient continues to take Cialis and this helps with his voiding habits as well as with erectile dysfunction .         Patient Active Problem List   Diagnosis Date Noted  . Hyperlipidemia 05/04/2012  . Screening 05/04/2012  . Erectile dysfunction 05/04/2012  . 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 Unspecified hemorrhoids without mention of complication   . Bundle branch block, unspecified   . BPH (benign prostatic hyperplasia)    Outpatient Encounter Prescriptions as of 11/08/2013  Medication Sig  . aspirin 81 MG chewable tablet Chew 81 mg by mouth daily.  . Azelastine HCl 0.15 % SOLN USE 1 SPRAYS INTO EACH NOSTRIL AS NEEDED FOR NASAL CONGESTION  . Cholecalciferol (VITAMIN D) 2000 UNITS CAPS Take 1 capsule by mouth daily.  . CRESTOR 10 MG tablet TAKE (1) TABLET DAILY AS DIRECTED.  . fluticasone (FLONASE) 50 MCG/ACT nasal spray 1 SPRAY IN EACH NOSTRIL ONCE A DAY  . niacin (NIASPAN) 500 MG CR tablet TAKE 4 TABLETS ONCE DAILY AS DIRECTED  . omega-3 acid ethyl esters (LOVAZA) 1 G capsule Take 2 g by mouth daily.   . Vardenafil HCl 10 MG TBDP Take 1 tablet by mouth daily as needed.    Review of Systems  Constitutional: Negative.   HENT: Negative.   Eyes: Negative.   Respiratory: Negative.   Cardiovascular: Negative.   Gastrointestinal: Negative.   Endocrine: Negative.   Genitourinary: Negative.   Musculoskeletal: Negative.   Skin: Negative.   Allergic/Immunologic: Negative.   Neurological: Negative.   Hematological: Negative.   Psychiatric/Behavioral: Negative.        Objective:   Physical Exam  Nursing note and vitals  reviewed. Constitutional: He is oriented to person, place, and time. He appears well-developed and well-nourished. No distress.  HENT:  Head: Normocephalic and atraumatic.  Right Ear: External ear normal.  Left Ear: External ear normal.  Mouth/Throat: Oropharynx is clear and moist. No oropharyngeal exudate.  Minimal nasal congestion left greater than right  Eyes: Conjunctivae and EOM are normal. Pupils are equal, round, and reactive to light. Right eye exhibits no discharge. Left eye exhibits no discharge. No scleral icterus.  Neck: Normal range of motion. Neck supple. No thyromegaly present.  No anterior cervical adenopathy or carotid bruits  Cardiovascular: Normal rate, regular rhythm, normal heart sounds and intact distal pulses.  Exam reveals no gallop and no friction rub.   No murmur heard. At 60 per minute  Pulmonary/Chest: Effort normal and breath sounds normal. No respiratory distress. He has no wheezes. He has no rales. He exhibits no tenderness.  Clear anteriorly and posteriorly and with no axillary adenopathy or chest wall masses  Abdominal: Soft. Bowel sounds are normal. He exhibits no mass. There is no tenderness. There is no rebound and no guarding.  No inguinal nodes.  Genitourinary: Rectum normal and penis normal.  The prostate was minimally enlarged without lumps or masses. There were some external hemorrhoids which are not inflamed. There were no rectal masses. A small inguinal hernia was noted on the right side. The external genitalia were normal and the testicles  were normal  Musculoskeletal: Normal range of motion. He exhibits no edema and no tenderness.  Lymphadenopathy:    He has no cervical adenopathy.  Neurological: He is alert and oriented to person, place, and time. He has normal reflexes. No cranial nerve deficit.  Skin: Skin is warm and dry. No rash noted. No erythema. No pallor.  Psychiatric: He has a normal mood and affect. His behavior is normal. Judgment and  thought content normal.    BP 114/78  Pulse 67  Temp(Src) 97.3 F (36.3 C) (Oral)  Ht _0  (1.778 m)  Wt 187 lb (84.823 kg)  BMI 26.83 kg/m2       Assessment & Plan:  1. Hyperlipidemia - POCT CBC - BMP8+EGFR - Hepatic function panel - NMR, lipoprofile  2. BPH (benign prostatic hyperplasia) - POCT CBC - POCT UA - Microscopic Only - POCT urinalysis dipstick - PSA, total and free - Urine culture  3. Special screening for malignant neoplasms, colon - Ambulatory referral to Gastroenterology - POCT CBC  4. Annual physical exam - POCT CBC - POCT UA - Microscopic Only - POCT urinalysis dipstick - BMP8+EGFR - Hepatic function panel - PSA, total and free - NMR, lipoprofile - Vit D  25 hydroxy (rtn osteoporosis monitoring) - Urine culture  5. Allergic rhinitis due to pollen  6. Erectile dysfunction due to arterial insufficiency  7. First degree hemorrhoids  8. Right inguinal hernia -We will continue to monitor this.  No orders of the defined types were placed in this encounter.   Patient Instructions  Continue current medications. Continue good therapeutic lifestyle changes which include good diet and exercise. Fall precautions discussed with patient. If an FOBT was given today- please return it to our front desk. If you are over 82 years old - you may need Prevnar 71 or the adult Pneumonia vaccine.  Flu Shots will be available at our office starting mid- September. Please call and schedule a FLU CLINIC APPOINTMENT.   Continue to use Cialis as directed Continue to use nasal sprays and saline nose spray Continue to drink plenty of fluids Check with your insurance as noted above regarding the Prevnar vaccine and the shingles shot We will arrange for you to get a colonoscopy    Arrie Senate MD

## 2013-11-09 ENCOUNTER — Encounter: Payer: Self-pay | Admitting: Internal Medicine

## 2013-11-09 LAB — HEPATIC FUNCTION PANEL
ALBUMIN: 4.5 g/dL (ref 3.5–5.5)
ALT: 21 IU/L (ref 0–44)
AST: 8 IU/L (ref 0–40)
Alkaline Phosphatase: 67 IU/L (ref 39–117)
BILIRUBIN TOTAL: 0.9 mg/dL (ref 0.0–1.2)
Bilirubin, Direct: 0.19 mg/dL (ref 0.00–0.40)
TOTAL PROTEIN: 6.3 g/dL (ref 6.0–8.5)

## 2013-11-09 LAB — PSA, TOTAL AND FREE
PSA FREE PCT: 37.1 %
PSA FREE: 0.26 ng/mL
PSA: 0.7 ng/mL (ref 0.0–4.0)

## 2013-11-09 LAB — BMP8+EGFR
BUN/Creatinine Ratio: 11 (ref 9–20)
BUN: 13 mg/dL (ref 6–24)
CALCIUM: 9.5 mg/dL (ref 8.7–10.2)
CHLORIDE: 103 mmol/L (ref 97–108)
CO2: 25 mmol/L (ref 18–29)
CREATININE: 1.19 mg/dL (ref 0.76–1.27)
GFR calc Af Amer: 82 mL/min/{1.73_m2} (ref >59)
GFR calc non Af Amer: 71 mL/min/{1.73_m2} (ref >59)
GLUCOSE: 94 mg/dL (ref 65–99)
Potassium: 5.3 mmol/L — ABNORMAL HIGH (ref 3.5–5.2)
Sodium: 143 mmol/L (ref 134–144)

## 2013-11-09 LAB — NMR, LIPOPROFILE
Cholesterol: 133 mg/dL (ref 100–199)
HDL CHOLESTEROL BY NMR: 44 mg/dL (ref >39)
HDL Particle Number: 28.9 umol/L — ABNORMAL LOW (ref >=30.5)
LDL PARTICLE NUMBER: 933 nmol/L (ref <1000)
LDL SIZE: 20.6 nm (ref >20.5)
LDLC SERPL CALC-MCNC: 77 mg/dL (ref 0–99)
LP-IR Score: 33 (ref <=45)
SMALL LDL PARTICLE NUMBER: 416 nmol/L (ref <=527)
TRIGLYCERIDES BY NMR: 61 mg/dL (ref 0–149)

## 2013-11-09 LAB — VITAMIN D 25 HYDROXY (VIT D DEFICIENCY, FRACTURES): VIT D 25 HYDROXY: 54.3 ng/mL (ref 30.0–100.0)

## 2013-11-09 LAB — URINE CULTURE: ORGANISM ID, BACTERIA: NO GROWTH

## 2013-11-10 ENCOUNTER — Telehealth: Payer: Self-pay | Admitting: *Deleted

## 2013-11-10 NOTE — Telephone Encounter (Signed)
Message copied by Almeta MonasSTONE, Eather Chaires M on Wed Nov 10, 2013  2:49 PM ------      Message from: Ernestina PennaMOORE, DONALD W      Created: Wed Nov 10, 2013  7:12 AM       A urine culture done on this patient was negative for bacterial growth ------

## 2013-11-10 NOTE — Telephone Encounter (Signed)
Aware of results. 

## 2013-12-20 ENCOUNTER — Other Ambulatory Visit: Payer: Self-pay | Admitting: Family Medicine

## 2013-12-20 NOTE — Telephone Encounter (Signed)
Refilled per protocol, seen in Oct

## 2013-12-23 ENCOUNTER — Ambulatory Visit (AMBULATORY_SURGERY_CENTER): Payer: Self-pay | Admitting: *Deleted

## 2013-12-23 VITALS — Ht 70.0 in | Wt 190.8 lb

## 2013-12-23 DIAGNOSIS — Z1211 Encounter for screening for malignant neoplasm of colon: Secondary | ICD-10-CM

## 2013-12-23 MED ORDER — MOVIPREP 100 G PO SOLR: ORAL | Status: DC

## 2013-12-23 NOTE — Progress Notes (Signed)
No allergies to eggs or soy. No problems with anesthesia.  Pt given Emmi instructions for colonoscopy  No oxygen use  No diet drug use  

## 2014-01-05 ENCOUNTER — Encounter: Payer: BC Managed Care – PPO | Admitting: Internal Medicine

## 2014-01-10 ENCOUNTER — Ambulatory Visit (AMBULATORY_SURGERY_CENTER): Payer: BC Managed Care – PPO | Admitting: Internal Medicine

## 2014-01-10 ENCOUNTER — Encounter: Payer: Self-pay | Admitting: Internal Medicine

## 2014-01-10 VITALS — BP 107/75 | HR 61 | Temp 96.2°F | Resp 16 | Ht 70.0 in | Wt 190.0 lb

## 2014-01-10 DIAGNOSIS — Z1211 Encounter for screening for malignant neoplasm of colon: Secondary | ICD-10-CM

## 2014-01-10 MED ORDER — SODIUM CHLORIDE 0.9 % IV SOLN: 500.0000 mL | INTRAVENOUS | Status: DC

## 2014-01-10 NOTE — Patient Instructions (Signed)
YOU HAD AN ENDOSCOPIC PROCEDURE TODAY AT THE [facility]: Refer to the procedure report that was given to you for any specific questions about what was found during the examination.  If the procedure report does not answer your questions, please call your gastroenterologist to clarify.  If you requested that your care partner not be given the details of your procedure findings, then the procedure report has been included in a sealed envelope for you to review at your convenience later.  YOU SHOULD EXPECT: Some feelings of bloating in the abdomen. Passage of more gas than usual.  Walking can help get rid of the air that was put into your GI tract during the procedure and reduce the bloating. If you had a lower endoscopy (such as a colonoscopy or flexible sigmoidoscopy) you may notice spotting of blood in your stool or on the toilet paper. If you underwent a bowel prep for your procedure, then you may not have a normal bowel movement for a few days.  DIET: Your first meal following the procedure should be a light meal and then it is ok to progress to your normal diet.  A half-sandwich or bowl of soup is an example of a good first meal.  Heavy or fried foods are harder to digest and may make you feel nauseous or bloated.  Likewise meals heavy in dairy and vegetables can cause extra gas to form and this can also increase the bloating.  Drink plenty of fluids but you should avoid alcoholic beverages for 24 hours.  ACTIVITY: Your care partner should take you home directly after the procedure.  You should plan to take it easy, moving slowly for the rest of the day.  You can resume normal activity the day after the procedure however you should NOT DRIVE or use heavy machinery for 24 hours (because of the sedation medicines used during the test).    SYMPTOMS TO REPORT IMMEDIATELY: A gastroenterologist can be reached at any hour.  During normal business hours, 8:30 AM to 5:00 PM Monday through Friday,  call [phone].  After hours and on weekends, please call the GI answering service at [phone] who will take a message and have the physician on call contact you.   Following lower endoscopy (colonoscopy or flexible sigmoidoscopy):  Excessive amounts of blood in the stool  Significant tenderness or worsening of abdominal pains  Swelling of the abdomen that is new, acute  Fever of 100F or higher    FOLLOW UP: If any biopsies were taken you will be contacted by phone or by letter within the next 1-3 weeks.  Call your gastroenterologist if you have not heard about the biopsies in 3 weeks.  Our staff will call the home number listed on your records the next business day following your procedure to check on you and address any questions or concerns that you may have at that time regarding the information given to you following your procedure. This is a courtesy call and so if there is no answer at the home number and we have not heard from you through the emergency physician on call, we will assume that you have returned to your regular daily activities without incident.  SIGNATURES/CONFIDENTIALITY: You and/or your care partner have signed paperwork which will be entered into your electronic medical record.  These signatures attest to the fact that that the information above on your After Visit Summary has been reviewed and is understood.  Full responsibility of the confidentiality   of this discharge information lies with you and/or your care-partner.     

## 2014-01-10 NOTE — Op Note (Signed)
[facility] [address] [place], [id number]   COLONOSCOPY PROCEDURE REPORT  PATIENT: Kyle Andrade, [name]  MR#: [id number] BIRTHDATE: [birth year] , 48  yrs. old GENDER: male ENDOSCOPIST: Beverley FiedlerJay M Marry Kusch, MD REFERRED XB:JYNWGNBY:Donald Christell ConstantMoore, M.D. PROCEDURE DATE:  01/10/2014 PROCEDURE:   Colonoscopy, screening First Screening Colonoscopy - Avg.  risk and is 50 yrs.  old or older Yes.  Prior Negative Screening - Now for repeat screening. N/A  History of Adenoma - Now for follow-up colonoscopy & has been > or = to 3 yrs.  N/A  Polyps Removed Today? No.  Polyps Removed Today? No.  Recommend repeat exam, <10 yrs? Polyps Removed Today? No.  Recommend repeat exam, <10 yrs? No. ASA CLASS:   Class II INDICATIONS:average risk for colon cancer and first colonoscopy. MEDICATIONS: Monitored anesthesia care and Propofol 250 mg IV  DESCRIPTION OF PROCEDURE:   After the risks benefits and alternatives of the procedure were thoroughly explained, informed consent was obtained.  The digital rectal exam revealed no rectal mass.   The LB [id number]  endoscope was introduced through the anus and advanced to the cecum, which was identified by both the appendix and ileocecal valve. No adverse events experienced. The quality of the prep was excellent, using MoviPrep  The instrument was then slowly withdrawn as the colon was fully examined.   COLON FINDINGS: There was mild diverticulosis noted in the ascending colon, descending colon, and sigmoid colon.   The examination was otherwise normal.  Retroflexed views revealed internal hemorrhoids. The time to cecum=6 minutes 01 seconds.  Withdrawal time=9 minutes 59 seconds.  The scope was withdrawn and the procedure completed.  COMPLICATIONS: There were no immediate complications.  ENDOSCOPIC IMPRESSION: 1.   Mild diverticulosis was noted in the ascending colon, descending colon, and sigmoid colon 2.   The examination was otherwise  normal  RECOMMENDATIONS: 1.  High fiber diet 2.  You should continue to follow colorectal cancer screening guidelines for "routine risk" patients with a repeat colonoscopy in 10 years.  There is no need for FOBT (stool) testing for at least 5 years.  eSigned:  Beverley FiedlerJay M Jhania Etherington, MD 01/10/2014 8:53 AM  cc: Rudi Heaponald Moore, MD and The Patient

## 2014-01-10 NOTE — Progress Notes (Signed)
A/ox3 pleased with MAC, report to Karen RN 

## 2014-01-11 ENCOUNTER — Telehealth: Payer: Self-pay | Admitting: *Deleted

## 2014-01-11 NOTE — Telephone Encounter (Signed)
  Follow up Call-  Call back number 01/10/2014  Post procedure Call Back phone  # [phone]  Permission to leave phone message Yes     Patient questions:  Message left to call us if necessary.

## 2014-07-18 ENCOUNTER — Other Ambulatory Visit: Payer: Self-pay | Admitting: Family Medicine

## 2014-08-11 ENCOUNTER — Encounter: Payer: Self-pay | Admitting: Physician Assistant

## 2014-08-11 ENCOUNTER — Ambulatory Visit (INDEPENDENT_AMBULATORY_CARE_PROVIDER_SITE_OTHER): Payer: BC Managed Care – PPO | Admitting: Physician Assistant

## 2014-08-11 VITALS — BP 127/78 | HR 78 | Temp 97.4°F | Ht 70.0 in | Wt 185.0 lb

## 2014-08-11 DIAGNOSIS — J069 Acute upper respiratory infection, unspecified: Secondary | ICD-10-CM

## 2014-08-11 MED ORDER — AMOXICILLIN-POT CLAVULANATE 875-125 MG PO TABS: 1.0000 | ORAL_TABLET | Freq: Two times a day (BID) | ORAL | Status: DC

## 2014-08-11 NOTE — Patient Instructions (Signed)
Benadryl as directed. If you cant take benadryl d/t drowsiness, take zyrtec   May add Musinex Max ( no decongestant) Continue to use flonase - may need to lightly coat inside of nose with vaseline    Upper Respiratory Infection, Adult An upper respiratory infection (URI) is also sometimes known as the common cold. The upper respiratory tract includes the nose, sinuses, throat, trachea, and bronchi. Bronchi are the airways leading to the lungs. Most people improve within 1 week, but symptoms can last up to 2 weeks. A residual cough may last even longer.  CAUSES Many different viruses can infect the tissues lining the upper respiratory tract. The tissues become irritated and inflamed and often become very moist. Mucus production is also common. A cold is contagious. You can easily spread the virus to others by oral contact. This includes kissing, sharing a glass, coughing, or sneezing. Touching your mouth or nose and then touching a surface, which is then touched by another person, can also spread the virus. SYMPTOMS  Symptoms typically develop 1 to 3 days after you come in contact with a cold virus. Symptoms vary from person to person. They may include:  Runny nose.  Sneezing.  Nasal congestion.  Sinus irritation.  Sore throat.  Loss of voice (laryngitis).  Cough.  Fatigue.  Muscle aches.  Loss of appetite.  Headache.  Low-grade fever. DIAGNOSIS  You might diagnose your own cold based on familiar symptoms, since most people get a cold 2 to 3 times a year. Your caregiver can confirm this based on your exam. Most importantly, your caregiver can check that your symptoms are not due to another disease such as strep throat, sinusitis, pneumonia, asthma, or epiglottitis. Blood tests, throat tests, and X-rays are not necessary to diagnose a common cold, but they may sometimes be helpful in excluding other more serious diseases. Your caregiver will decide if any further tests are  required. RISKS AND COMPLICATIONS  You may be at risk for a more severe case of the common cold if you smoke cigarettes, have chronic heart disease (such as heart failure) or lung disease (such as asthma), or if you have a weakened immune system. The very young and very old are also at risk for more serious infections. Bacterial sinusitis, middle ear infections, and bacterial pneumonia can complicate the common cold. The common cold can worsen asthma and chronic obstructive pulmonary disease (COPD). Sometimes, these complications can require emergency medical care and may be life-threatening. PREVENTION  The best way to protect against getting a cold is to practice good hygiene. Avoid oral or hand contact with people with cold symptoms. Wash your hands often if contact occurs. There is no clear evidence that vitamin C, vitamin E, echinacea, or exercise reduces the chance of developing a cold. However, it is always recommended to get plenty of rest and practice good nutrition. TREATMENT  Treatment is directed at relieving symptoms. There is no cure. Antibiotics are not effective, because the infection is caused by a virus, not by bacteria. Treatment may include:  Increased fluid intake. Sports drinks offer valuable electrolytes, sugars, and fluids.  Breathing heated mist or steam (vaporizer or shower).  Eating chicken soup or other clear broths, and maintaining good nutrition.  Getting plenty of rest.  Using gargles or lozenges for comfort.  Controlling fevers with ibuprofen or acetaminophen as directed by your caregiver.  Increasing usage of your inhaler if you have asthma. Zinc gel and zinc lozenges, taken in the first 24 hours of  the common cold, can shorten the duration and lessen the severity of symptoms. Pain medicines may help with fever, muscle aches, and throat pain. A variety of non-prescription medicines are available to treat congestion and runny nose. Your caregiver can make  recommendations and may suggest nasal or lung inhalers for other symptoms.  HOME CARE INSTRUCTIONS   Only take over-the-counter or prescription medicines for pain, discomfort, or fever as directed by your caregiver.  Use a warm mist humidifier or inhale steam from a shower to increase air moisture. This may keep secretions moist and make it easier to breathe.  Drink enough water and fluids to keep your urine clear or pale yellow.  Rest as needed.  Return to work when your temperature has returned to normal or as your caregiver advises. You may need to stay home longer to avoid infecting others. You can also use a face mask and careful hand washing to prevent spread of the virus. SEEK MEDICAL CARE IF:   After the first few days, you feel you are getting worse rather than better.  You need your caregiver's advice about medicines to control symptoms.  You develop chills, worsening shortness of breath, or brown or red sputum. These may be signs of pneumonia.  You develop yellow or brown nasal discharge or pain in the face, especially when you bend forward. These may be signs of sinusitis.  You develop a fever, swollen neck glands, pain with swallowing, or white areas in the back of your throat. These may be signs of strep throat. SEEK IMMEDIATE MEDICAL CARE IF:   You have a fever.  You develop severe or persistent headache, ear pain, sinus pain, or chest pain.  You develop wheezing, a prolonged cough, cough up blood, or have a change in your usual mucus (if you have chronic lung disease).  You develop sore muscles or a stiff neck. Document Released: 07/03/2000 Document Revised: 04/01/2011 Document Reviewed: 04/14/2013 [facility] Patient Information 2015 [place], [place]. This information is not intended to replace advice given to you by your health care provider. Make sure you discuss any questions you have with your health care provider.

## 2014-08-11 NOTE — Progress Notes (Signed)
   Subjective:    Patient ID: Kyle Andrade, male    DOB: Sep 18, 1965, 49 y.o.   MRN: [id number]  HPI 49 y/o male presents with c/o runny nose x 1 day all of a sudden. He has tried flonase and astepro with no relief.    Review of Systems  Constitutional: Negative for fever, chills, diaphoresis and fatigue.  HENT: Positive for congestion, postnasal drip, rhinorrhea, sinus pressure, sneezing and sore throat. Negative for ear pain.   Eyes: Negative.   Respiratory: Negative for cough and wheezing.   Cardiovascular: Negative.   Gastrointestinal: Negative.   Neurological: Positive for headaches.  All other systems reviewed and are negative.      Objective:   Physical Exam  Constitutional: He is oriented to person, place, and time. He appears well-developed and well-nourished.  HENT:  Head: Normocephalic.  Right Ear: External ear normal.  Left Ear: External ear normal.  Posterior pharynx injection bilaterally   Cardiovascular: Normal rate, regular rhythm and normal heart sounds.  Exam reveals no gallop and no friction rub.   No murmur heard. Pulmonary/Chest: Effort normal and breath sounds normal. No respiratory distress. He has no wheezes. He has no rales. He exhibits no tenderness.  Neurological: He is alert and oriented to person, place, and time.  Psychiatric: He has a normal mood and affect. His behavior is normal. Judgment and thought content normal.  Nursing note and vitals reviewed.         Assessment & Plan:  1. Acute upper respiratory infection  - amoxicillin-clavulanate (AUGMENTIN) 875-125 MG per tablet; Take 1 tablet by mouth 2 (two) times daily.  Dispense: 20 tablet; Refill: 0   Continue all meds Labs pending Health Maintenance reviewed Diet and exercise encouraged RTO prn   Zander Ingham A. Chauncey Reading PA-C

## 2014-10-18 ENCOUNTER — Other Ambulatory Visit: Payer: Self-pay | Admitting: Family Medicine

## 2014-10-20 ENCOUNTER — Telehealth: Payer: Self-pay

## 2014-10-20 NOTE — Telephone Encounter (Signed)
Insurance prior authorized Omega 3 Ethyl ester

## 2014-11-14 ENCOUNTER — Ambulatory Visit (INDEPENDENT_AMBULATORY_CARE_PROVIDER_SITE_OTHER): Payer: BC Managed Care – PPO | Admitting: Family Medicine

## 2014-11-14 ENCOUNTER — Encounter: Payer: Self-pay | Admitting: Family Medicine

## 2014-11-14 ENCOUNTER — Ambulatory Visit (INDEPENDENT_AMBULATORY_CARE_PROVIDER_SITE_OTHER): Payer: BC Managed Care – PPO

## 2014-11-14 VITALS — BP 109/75 | HR 63 | Temp 97.5°F | Ht 70.0 in | Wt 184.6 lb

## 2014-11-14 DIAGNOSIS — R0989 Other specified symptoms and signs involving the circulatory and respiratory systems: Secondary | ICD-10-CM

## 2014-11-14 DIAGNOSIS — E559 Vitamin D deficiency, unspecified: Secondary | ICD-10-CM

## 2014-11-14 DIAGNOSIS — Z0000 Encounter for general adult medical examination without abnormal findings: Secondary | ICD-10-CM

## 2014-11-14 DIAGNOSIS — M25512 Pain in left shoulder: Secondary | ICD-10-CM

## 2014-11-14 DIAGNOSIS — N400 Enlarged prostate without lower urinary tract symptoms: Secondary | ICD-10-CM

## 2014-11-14 DIAGNOSIS — K4090 Unilateral inguinal hernia, without obstruction or gangrene, not specified as recurrent: Secondary | ICD-10-CM

## 2014-11-14 DIAGNOSIS — Z23 Encounter for immunization: Secondary | ICD-10-CM | POA: Diagnosis not present

## 2014-11-14 DIAGNOSIS — E785 Hyperlipidemia, unspecified: Secondary | ICD-10-CM

## 2014-11-14 DIAGNOSIS — J301 Allergic rhinitis due to pollen: Secondary | ICD-10-CM

## 2014-11-14 LAB — POCT URINALYSIS DIPSTICK
BILIRUBIN UA: NEGATIVE
Blood, UA: NEGATIVE
Glucose, UA: NEGATIVE
KETONES UA: NEGATIVE
LEUKOCYTES UA: NEGATIVE
Nitrite, UA: NEGATIVE
PH UA: 5
Protein, UA: NEGATIVE
Spec Grav, UA: 1.025
Urobilinogen, UA: NEGATIVE

## 2014-11-14 LAB — POCT UA - MICROSCOPIC ONLY
Bacteria, U Microscopic: NEGATIVE
CASTS, UR, LPF, POC: NEGATIVE
Crystals, Ur, HPF, POC: NEGATIVE
MUCUS UA: NEGATIVE
RBC, urine, microscopic: NEGATIVE
WBC, UR, HPF, POC: NEGATIVE
YEAST UA: NEGATIVE

## 2014-11-14 NOTE — Patient Instructions (Addendum)
Continue current medications. Continue good therapeutic lifestyle changes which include good diet and exercise. Fall precautions discussed with patient. If an FOBT was given today- please return it to our front desk. If you are over 51 years old - you may need Prevnar 13 or the adult Pneumonia vaccine.  Flu Shots will be available at our office starting mid- September. Please call and schedule a FLU CLINIC APPOINTMENT.   Please return the FOBT If you develop any significant pain in the left groin area please call us immediately or go to the emergency room. If you develop increased soreness refill at the hernia is getting bigger or giving him more trouble, please call us and we will arrange for you to see the surgeon to have you further evaluated If the shoulder does not get better get back in touch with us and we will arrange for an injection You will get the Prevnar and the flu shot today. If you decide to get the shingles shot you should wait at least a month and come back and get this Stay active and exercise regularly

## 2014-11-14 NOTE — Progress Notes (Signed)
Subjective:    Patient ID: Kyle Andrade, male    DOB: [birth year], 49 y.o.   MRN: [id number]  HPI Patient is here today for a complete physical. Patient is doing well today with no complaints. He is due to get his routine lab work including his PSA. He had a colonoscopy in May 2015 and this showed some diverticulosis and was otherwise normal and clear and the next colonoscopy was planned for 10 years from that time. The patient denies chest pain shortness of breath trouble swallowing heartburn indigestion nausea vomiting diarrhea . He stays active and feels as good as he has ever felt. He is passing his water without problems. He does have occasional blood in the stool and this comes from some internal hemorrhoids. In reviewing his parents history they are both still living and his father has some emphysema and his mother has some problems with her memory. He has 2 sisters and 1 brother and he is in the middle of the group and all of his siblings are in good health. The patient does indicate that his insurance will pay for his Prevnar vaccine and shingles vaccine..   Review of Systems  Constitutional: Negative.   HENT: Negative.   Eyes: Negative.   Respiratory: Negative.   Cardiovascular: Negative.   Gastrointestinal: Negative.   Endocrine: Negative.   Genitourinary: Negative.   Musculoskeletal: Negative.   Skin: Negative.   Allergic/Immunologic: Negative.   Neurological: Negative.   Hematological: Negative.   Psychiatric/Behavioral: Negative.           Patient Active Problem List   Diagnosis Date Noted  . Allergic rhinitis 11/08/2013  . First degree hemorrhoids 11/08/2013  . Right inguinal hernia 11/08/2013  . Hyperlipidemia 05/04/2012  . Screening 05/04/2012  . Erectile dysfunction 05/04/2012  . 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 Unspecified hemorrhoids without mention of complication   . Bundle branch block, unspecified   . BPH (benign prostatic  hyperplasia)    Outpatient Encounter Prescriptions as of 11/14/2014  Medication Sig  . aspirin 81 MG chewable tablet Chew 81 mg by mouth daily.  . Azelastine HCl 0.15 % SOLN USE 1 SPRAYS INTO EACH NOSTRIL AS NEEDED FOR NASAL CONGESTION  . Cholecalciferol (VITAMIN D) 2000 UNITS CAPS Take 1 capsule by mouth daily.  . fluticasone (FLONASE) 50 MCG/ACT nasal spray 1 SPRAY IN EACH NOSTRIL ONCE A DAY  . omega-3 acid ethyl esters (LOVAZA) 1 G capsule TAKE 4 CAPSULES DAILY AS DIRECTED  . [DISCONTINUED] amoxicillin-clavulanate (AUGMENTIN) 875-125 MG per tablet Take 1 tablet by mouth 2 (two) times daily.  . [DISCONTINUED] CRESTOR 10 MG tablet TAKE (1) TABLET DAILY AS DIRECTED. (Patient not taking: Reported on 08/11/2014)  . [DISCONTINUED] Vardenafil HCl 10 MG TBDP Take 1 tablet by mouth daily as needed. (Patient not taking: Reported on 12/23/2013)   No facility-administered encounter medications on file as of 11/14/2014.       Objective:   Physical Exam  Constitutional: He is oriented to person, place, and time. He appears well-developed and well-nourished. No distress.  HENT:  Head: Normocephalic and atraumatic.  Right Ear: External ear normal.  Left Ear: External ear normal.  Mouth/Throat: Oropharynx is clear and moist. No oropharyngeal exudate.  Slight nasal congestion left greater than right  Eyes: Conjunctivae and EOM are normal. Pupils are equal, round, and reactive to light. Right eye exhibits no discharge. Left eye exhibits no discharge. No scleral icterus.  The patient is up-to-date on his eye  exam  Neck: Normal range of motion. Neck supple. No tracheal deviation present. No thyromegaly present.  Cardiovascular: Normal rate, regular rhythm, normal heart sounds and intact distal pulses.  Exam reveals no gallop and no friction rub.   No murmur heard. At 60/m  Pulmonary/Chest: Effort normal and breath sounds normal. No respiratory distress. He has no wheezes. He has no rales. He exhibits no  tenderness.  Clear anteriorly and posteriorly  Abdominal: Soft. Bowel sounds are normal. He exhibits no mass. There is no tenderness. There is no rebound and no guarding.  The patient has a left inguinal hernia that is not sensitive to palpation. It is obvious when doing the genitalia exam and when standing.  Genitourinary: Rectum normal, prostate normal and penis normal.  If the prostate is enlarged it is minimal enlargement. The patient does have a left inguinal hernia that is palpable and he was made aware of this today on the exam. The external genitalia were within normal limits. There were no rectal masses.  Musculoskeletal: Normal range of motion. He exhibits no edema or tenderness.  Lymphadenopathy:    He has no cervical adenopathy.  Neurological: He is alert and oriented to person, place, and time. He has normal reflexes. No cranial nerve deficit.  Skin: Skin is warm and dry. No rash noted. No erythema. No pallor.  Psychiatric: He has a normal mood and affect. His behavior is normal. Judgment and thought content normal.  Nursing note and vitals reviewed.  BP 109/75 mmHg  Pulse 63  Temp(Src) 97.5 F (36.4 C) (Oral)  Ht $R'5\' 10"'Tk$  (1.778 m)  Wt 184 lb 9.6 oz (83.734 kg)  BMI 26.49 kg/m2  [facility] reading (PRIMARY) by  Dr. Brunilda Payor x-ray- no active disease                                      Assessment & Plan:  1. Hyperlipidemia -The patient has a history of this and he is been tried on statin drugs and intolerant to them. We will encourage him to continue with aggressive therapeutic lifestyle changes. - CBC with Differential/Platelet - NMR, lipoprofile - BMP8+EGFR - Hepatic function panel  2. Vitamin D deficiency -He should continue with his current vitamin D dosing pending results of lab work - Vit D  25 hydroxy (rtn osteoporosis monitoring)  3. BPH (benign prostatic hyperplasia) -There is minimal if any prostate enlargement area there is no lumps palpable. - PSA  Total+%Free (Serial) - POCT UA - Microscopic Only - POCT urinalysis dipstick  4. Left shoulder pain -This is getting better with the patient's on treatment using ibuprofen and moist heat and range of motion exercises. The patient was asked to come back and see [place] if the improvement did not continue. - DG Chest 2 View; Future  5. Hyperinflation of lungs -This is from a previous chest x-ray and his father does have emphysema, but this patient has never smoked we will follow-up on the chest x-ray today to make sure there is no change in this. - DG Chest 2 View; Future  6. Allergic rhinitis due to pollen -He continues to use his nasal sprays and this keeps this under good control.  7. Left inguinal hernia -He is aware of the hernia and understands that he has more trouble has more symptoms she should get back in touch with [place].  8. Annual exam  No orders of the defined types  were placed in this encounter.   Patient Instructions  Continue current medications. Continue good therapeutic lifestyle changes which include good diet and exercise. Fall precautions discussed with patient. If an FOBT was given today- please return it to our front desk. If you are over 2 years old - you may need Prevnar 48 or the adult Pneumonia vaccine.  Flu Shots will be available at our office starting mid- September. Please call and schedule a FLU CLINIC APPOINTMENT.   Please return the FOBT If you develop any significant pain in the left groin area please call us immediately or go to the emergency room. If you develop increased soreness refill at the hernia is getting bigger or giving him more trouble, please call us and we will arrange for you to see the surgeon to have you further evaluated If the shoulder does not get better get back in touch with [place] and we will arrange for an injection You will get the Prevnar and the flu shot today. If you decide to get the shingles shot you should wait at least a month  and come back and get this Stay active and exercise regularly   Arrie Senate MD

## 2014-11-15 LAB — HEPATIC FUNCTION PANEL
ALBUMIN: 4.4 g/dL (ref 3.5–5.5)
ALK PHOS: 73 IU/L (ref 39–117)
ALT: 24 IU/L (ref 0–44)
AST: 11 IU/L (ref 0–40)
BILIRUBIN TOTAL: 0.5 mg/dL (ref 0.0–1.2)
Bilirubin, Direct: 0.09 mg/dL (ref 0.00–0.40)
TOTAL PROTEIN: 6.6 g/dL (ref 6.0–8.5)

## 2014-11-15 LAB — BMP8+EGFR
BUN/Creatinine Ratio: 12 (ref 9–20)
BUN: 13 mg/dL (ref 6–24)
CALCIUM: 9.3 mg/dL (ref 8.7–10.2)
CO2: 24 mmol/L (ref 18–29)
CREATININE: 1.09 mg/dL (ref 0.76–1.27)
Chloride: 105 mmol/L (ref 97–106)
GFR calc Af Amer: 90 mL/min/{1.73_m2} (ref >59)
GFR, EST NON AFRICAN AMERICAN: 78 mL/min/{1.73_m2} (ref >59)
GLUCOSE: 84 mg/dL (ref 65–99)
POTASSIUM: 4.8 mmol/L (ref 3.5–5.2)
Sodium: 146 mmol/L — ABNORMAL HIGH (ref 136–144)

## 2014-11-15 LAB — CBC WITH DIFFERENTIAL/PLATELET
BASOS: 1 %
Basophils Absolute: 0 10*3/uL (ref 0.0–0.2)
EOS (ABSOLUTE): 0.1 10*3/uL (ref 0.0–0.4)
Eos: 2 %
Hematocrit: 46.1 % (ref 37.5–51.0)
Hemoglobin: 15.8 g/dL (ref 12.6–17.7)
IMMATURE GRANULOCYTES: 0 %
Immature Grans (Abs): 0 10*3/uL (ref 0.0–0.1)
LYMPHS ABS: 1.7 10*3/uL (ref 0.7–3.1)
Lymphs: 31 %
MCH: 30.9 pg (ref 26.6–33.0)
MCHC: 34.3 g/dL (ref 31.5–35.7)
MCV: 90 fL (ref 79–97)
MONOS ABS: 0.5 10*3/uL (ref 0.1–0.9)
Monocytes: 9 %
NEUTROS PCT: 57 %
Neutrophils Absolute: 3.2 10*3/uL (ref 1.4–7.0)
PLATELETS: 191 10*3/uL (ref 150–379)
RBC: 5.11 x10E6/uL (ref 4.14–5.80)
RDW: 13.4 % (ref 12.3–15.4)
WBC: 5.6 10*3/uL (ref 3.4–10.8)

## 2014-11-15 LAB — NMR, LIPOPROFILE
Cholesterol: 192 mg/dL (ref 100–199)
HDL CHOLESTEROL BY NMR: 37 mg/dL — AB (ref >39)
HDL Particle Number: 25.3 umol/L — ABNORMAL LOW (ref >=30.5)
LDL PARTICLE NUMBER: 1592 nmol/L — AB (ref <1000)
LDL Size: 21.2 nm (ref >20.5)
LDL-C: 134 mg/dL — ABNORMAL HIGH (ref 0–99)
LP-IR Score: 40 (ref <=45)
Small LDL Particle Number: 462 nmol/L (ref <=527)
TRIGLYCERIDES BY NMR: 104 mg/dL (ref 0–149)

## 2014-11-15 LAB — PSA TOTAL+% FREE (SERIAL)
PSA FREE: 0.3 ng/mL
PSA, Free Pct: 30 %
Prostate Specific Ag, Serum: 1 ng/mL (ref 0.0–4.0)

## 2014-11-15 LAB — VITAMIN D 25 HYDROXY (VIT D DEFICIENCY, FRACTURES): Vit D, 25-Hydroxy: 56.3 ng/mL (ref 30.0–100.0)

## 2014-12-19 ENCOUNTER — Other Ambulatory Visit: Payer: Self-pay | Admitting: Family Medicine

## 2015-01-06 ENCOUNTER — Other Ambulatory Visit: Payer: Self-pay | Admitting: Family Medicine

## 2015-01-18 ENCOUNTER — Other Ambulatory Visit: Payer: Self-pay | Admitting: Family Medicine

## 2015-01-19 ENCOUNTER — Ambulatory Visit (INDEPENDENT_AMBULATORY_CARE_PROVIDER_SITE_OTHER): Payer: BC Managed Care – PPO | Admitting: Family

## 2015-01-19 ENCOUNTER — Encounter: Payer: Self-pay | Admitting: Family

## 2015-01-19 VITALS — BP 123/74 | HR 69 | Temp 97.0°F | Ht 70.0 in | Wt 186.0 lb

## 2015-01-19 DIAGNOSIS — M5412 Radiculopathy, cervical region: Secondary | ICD-10-CM | POA: Diagnosis not present

## 2015-01-19 MED ORDER — METHYLPREDNISOLONE ACETATE 80 MG/ML IJ SUSP
80.0000 mg | Freq: Once | INTRAMUSCULAR | Status: AC
  Administered 2015-01-19: 80 mg via INTRAMUSCULAR

## 2015-01-19 MED ORDER — KETOROLAC TROMETHAMINE 60 MG/2ML IM SOLN
60.0000 mg | Freq: Once | INTRAMUSCULAR | Status: AC
  Administered 2015-01-19: 60 mg via INTRAMUSCULAR

## 2015-01-19 MED ORDER — NAPROXEN 500 MG PO TABS: 500.0000 mg | ORAL_TABLET | Freq: Two times a day (BID) | ORAL | Status: DC

## 2015-01-19 MED ORDER — CYCLOBENZAPRINE HCL 5 MG PO TABS: 5.0000 mg | ORAL_TABLET | Freq: Three times a day (TID) | ORAL | Status: DC | PRN

## 2015-01-19 NOTE — Patient Instructions (Signed)

## 2015-01-19 NOTE — Progress Notes (Signed)
   Subjective:    Patient ID: Kyle Andrade, male    DOB: [birth year], 49 y.o.   MRN: [id number]  Shoulder Pain  The pain is present in the left shoulder. This is a new problem. The current episode started more than 1 month ago. There has been a history of trauma (Pt playing football with son). The problem occurs intermittently. The problem has been waxing and waning. The quality of the pain is described as aching and burning. The pain is at a severity of 3/10. The pain is moderate. Associated symptoms include numbness and tingling. Pertinent negatives include no inability to bear weight, itching, joint swelling or stiffness. The symptoms are aggravated by lying down. He has tried NSAIDS for the symptoms. The treatment provided mild relief. Family history does not include gout or rheumatoid arthritis.      Review of Systems  Constitutional: Negative.   HENT: Negative.   Respiratory: Negative.   Cardiovascular: Negative.   Gastrointestinal: Negative.   Endocrine: Negative.   Genitourinary: Negative.   Musculoskeletal: Negative.  Negative for stiffness.  Skin: Negative for itching.  Neurological: Positive for tingling and numbness.  Hematological: Negative.   Psychiatric/Behavioral: Negative.   All other systems reviewed and are negative.      Objective:   Physical Exam  Constitutional: He is oriented to person, place, and time. He appears well-developed and well-nourished. No distress.  HENT:  Head: Normocephalic.  Eyes: Pupils are equal, round, and reactive to light. Right eye exhibits no discharge. Left eye exhibits no discharge.  Neck: Normal range of motion. Neck supple. No thyromegaly present.  Cardiovascular: Normal rate, regular rhythm, normal heart sounds and intact distal pulses.   No murmur heard. Pulmonary/Chest: Effort normal and breath sounds normal. No respiratory distress. He has no wheezes.  Abdominal: Soft. Bowel sounds are normal. He exhibits no distension.  There is no tenderness.  Musculoskeletal: Normal range of motion. He exhibits no edema or tenderness.  Neurological: He is alert and oriented to person, place, and time. He has normal reflexes. No cranial nerve deficit.  Skin: Skin is warm and dry. No rash noted. No erythema.  Psychiatric: He has a normal mood and affect. His behavior is normal. Judgment and thought content normal.  Vitals reviewed.    BP 123/74 mmHg  Pulse 69  Temp(Src) 97 F (36.1 C) (Oral)  Ht 5\' 10"  (1.778 m)  Wt 186 lb (84.369 kg)  BMI 26.69 kg/m2      Assessment & Plan:  1. Cervical radiculitis -Rest -Ice and heat as needed  -No other NSAID's while taking naprosyn- Take with food -Sedation precaution discussed -RTO in 2 weeks  - ketorolac (TORADOL) injection 60 mg; Inject 2 mLs (60 mg total) into the muscle once. - methylPREDNISolone acetate (DEPO-MEDROL) injection 80 mg; Inject 1 mL (80 mg total) into the muscle once. - cyclobenzaprine (FLEXERIL) 5 MG tablet; Take 1 tablet (5 mg total) by mouth 3 (three) times daily as needed for muscle spasms.  Dispense: 30 tablet; Refill: 0 - naproxen (NAPROSYN) 500 MG tablet; Take 1 tablet (500 mg total) by mouth 2 (two) times daily with a meal.  Dispense: 30 tablet; Refill: 0  Jannifer Rodneyhristy Fady Stamps, FNP

## 2015-04-14 ENCOUNTER — Ambulatory Visit (INDEPENDENT_AMBULATORY_CARE_PROVIDER_SITE_OTHER): Payer: BC Managed Care – PPO | Admitting: Family

## 2015-04-14 ENCOUNTER — Encounter: Payer: Self-pay | Admitting: Family

## 2015-04-14 VITALS — BP 145/90 | HR 109 | Temp 98.4°F | Ht 70.0 in | Wt 185.4 lb

## 2015-04-14 DIAGNOSIS — J069 Acute upper respiratory infection, unspecified: Secondary | ICD-10-CM

## 2015-04-14 DIAGNOSIS — R6889 Other general symptoms and signs: Secondary | ICD-10-CM

## 2015-04-14 LAB — VERITOR FLU A/B WAIVED
INFLUENZA B: NEGATIVE
Influenza A: NEGATIVE

## 2015-04-14 MED ORDER — AZITHROMYCIN 250 MG PO TABS: ORAL_TABLET | ORAL | Status: DC

## 2015-04-14 MED ORDER — FLUTICASONE PROPIONATE 50 MCG/ACT NA SUSP: 2.0000 | Freq: Every day | NASAL | Status: DC

## 2015-04-14 NOTE — Patient Instructions (Signed)
Upper Respiratory Infection, Adult Most upper respiratory infections (URIs) are a viral infection of the air passages leading to the lungs. A URI affects the nose, throat, and upper air passages. The most common type of URI is nasopharyngitis and is typically referred to as "the common cold." URIs run their course and usually go away on their own. Most of the time, a URI does not require medical attention, but sometimes a bacterial infection in the upper airways can follow a viral infection. This is called a secondary infection. Sinus and middle ear infections are common types of secondary upper respiratory infections. Bacterial pneumonia can also complicate a URI. A URI can worsen asthma and chronic obstructive pulmonary disease (COPD). Sometimes, these complications can require emergency medical care and may be life threatening.  CAUSES Almost all URIs are caused by viruses. A virus is a type of germ and can spread from one person to another.  RISKS FACTORS You may be at risk for a URI if:   You smoke.   You have chronic heart or lung disease.  You have a weakened defense (immune) system.   You are very young or very old.   You have nasal allergies or asthma.  You work in crowded or poorly ventilated areas.  You work in health care facilities or schools. SIGNS AND SYMPTOMS  Symptoms typically develop 2-3 days after you come in contact with a cold virus. Most viral URIs last 7-10 days. However, viral URIs from the influenza virus (flu virus) can last 14-18 days and are typically more severe. Symptoms may include:   Runny or stuffy (congested) nose.   Sneezing.   Cough.   Sore throat.   Headache.   Fatigue.   Fever.   Loss of appetite.   Pain in your forehead, behind your eyes, and over your cheekbones (sinus pain).  Muscle aches.  DIAGNOSIS  Your health care provider may diagnose a URI by:  Physical exam.  Tests to check that your symptoms are not due to  another condition such as:  Strep throat.  Sinusitis.  Pneumonia.  Asthma. TREATMENT  A URI goes away on its own with time. It cannot be cured with medicines, but medicines may be prescribed or recommended to relieve symptoms. Medicines may help:  Reduce your fever.  Reduce your cough.  Relieve nasal congestion. HOME CARE INSTRUCTIONS   Take medicines only as directed by your health care provider.   Gargle warm saltwater or take cough drops to comfort your throat as directed by your health care provider.  Use a warm mist humidifier or inhale steam from a shower to increase air moisture. This may make it easier to breathe.  Drink enough fluid to keep your urine clear or pale yellow.   Eat soups and other clear broths and maintain good nutrition.   Rest as needed.   Return to work when your temperature has returned to normal or as your health care provider advises. You may need to stay home longer to avoid infecting others. You can also use a face mask and careful hand washing to prevent spread of the virus.  Increase the usage of your inhaler if you have asthma.   Do not use any tobacco products, including cigarettes, chewing tobacco, or electronic cigarettes. If you need help quitting, ask your health care provider. PREVENTION  The best way to protect yourself from getting a cold is to practice good hygiene.   Avoid oral or hand contact with people with cold   symptoms.   Wash your hands often if contact occurs.  There is no clear evidence that vitamin C, vitamin E, echinacea, or exercise reduces the chance of developing a cold. However, it is always recommended to get plenty of rest, exercise, and practice good nutrition.  SEEK MEDICAL CARE IF:   You are getting worse rather than better.   Your symptoms are not controlled by medicine.   You have chills.  You have worsening shortness of breath.  You have brown or red mucus.  You have yellow or brown nasal  discharge.  You have pain in your face, especially when you bend forward.  You have a fever.  You have swollen neck glands.  You have pain while swallowing.  You have white areas in the back of your throat. SEEK IMMEDIATE MEDICAL CARE IF:   You have severe or persistent:  Headache.  Ear pain.  Sinus pain.  Chest pain.  You have chronic lung disease and any of the following:  Wheezing.  Prolonged cough.  Coughing up blood.  A change in your usual mucus.  You have a stiff neck.  You have changes in your:  Vision.  Hearing.  Thinking.  Mood. MAKE SURE YOU:   Understand these instructions.  Will watch your condition.  Will get help right away if you are not doing well or get worse.   This information is not intended to replace advice given to you by your health care provider. Make sure you discuss any questions you have with your health care provider.   Document Released: 07/03/2000 Document Revised: 05/24/2014 Document Reviewed: 04/14/2013 Elsevier Interactive Patient Education 2016 [employer]  - Take meds as prescribed - Use a cool mist humidifier  -Use saline nose sprays frequently -Saline irrigations of the nose can be very helpful if done frequently.  * 4X daily for 1 week*  * Use of a nettie pot can be helpful with this. Follow directions with this* -Force fluids -For any cough or congestion  Use plain Mucinex- regular strength or max strength is fine   * Children- consult with Pharmacist for dosing -For fever or aces or pains- take tylenol or ibuprofen appropriate for age and weight.  * for fevers greater than 101 orally you may alternate ibuprofen and tylenol every  3 hours. -Throat lozenges if help   Julie Nay, FNP   

## 2015-04-14 NOTE — Progress Notes (Signed)
Subjective:    Patient ID: Kyle Andrade, male    DOB: [birth year], 50 y.o.   MRN: [id number]  Fever  Associated symptoms include congestion, coughing and headaches. Pertinent negatives include no ear pain.  Sore Throat  This is a new problem. The current episode started in the past 7 days. The problem has been gradually worsening. The pain is at a severity of 5/10. The pain is mild. Associated symptoms include congestion, coughing, headaches, a hoarse voice and shortness of breath. Pertinent negatives include no ear discharge, ear pain, neck pain, swollen glands or trouble swallowing. He has had no exposure to strep. He has tried acetaminophen for the symptoms. The treatment provided mild relief.      Review of Systems  Constitutional: Positive for fever.  HENT: Positive for congestion and hoarse voice. Negative for ear discharge, ear pain and trouble swallowing.   Respiratory: Positive for cough and shortness of breath.   Cardiovascular: Negative.   Gastrointestinal: Negative.   Endocrine: Negative.   Genitourinary: Negative.   Musculoskeletal: Negative.  Negative for neck pain.  Neurological: Positive for headaches.  Hematological: Negative.   Psychiatric/Behavioral: Negative.   All other systems reviewed and are negative.      Objective:   Physical Exam  Constitutional: He is oriented to person, place, and time. He appears well-developed and well-nourished. No distress.  HENT:  Head: Normocephalic.  Right Ear: External ear normal.  Left Ear: External ear normal.  Nasal passage erythemas with mild swelling  Oropharynx erythemas  Eyes: Pupils are equal, round, and reactive to light. Right eye exhibits no discharge. Left eye exhibits no discharge.  Neck: Normal range of motion. Neck supple. No thyromegaly present.  Cardiovascular: Normal rate, regular rhythm, normal heart sounds and intact distal pulses.   No murmur heard. Pulmonary/Chest: Effort normal and breath  sounds normal. No respiratory distress. He has no wheezes.  Abdominal: Soft. Bowel sounds are normal. He exhibits no distension. There is no tenderness.  Musculoskeletal: Normal range of motion. He exhibits no edema or tenderness.  Neurological: He is alert and oriented to person, place, and time. He has normal reflexes. No cranial nerve deficit.  Skin: Skin is warm and dry. No rash noted. No erythema.  Psychiatric: He has a normal mood and affect. His behavior is normal. Judgment and thought content normal.  Vitals reviewed.     Blood pressure 145/90, pulse 109, temperature 98.4 F (36.9 C), temperature source Oral, height 5\' 10"  (1.778 m), weight 185 lb 6.4 oz (84.097 kg). s    Assessment & Plan:  1. Flu-like symptoms - Veritor Flu A/B Waived  2. Acute upper respiratory infection -- Take meds as prescribed - Use a cool mist humidifier  -Use saline nose sprays frequently -Saline irrigations of the nose can be very helpful if done frequently.  * 4X daily for 1 week*  * Use of a nettie pot can be helpful with this. Follow directions with this* -Force fluids -For any cough or congestion  Use plain Mucinex- regular strength or max strength is fine   * Children- consult with Pharmacist for dosing -For fever or aces or pains- take tylenol or ibuprofen appropriate for age and weight.  * for fevers greater than 101 orally you may alternate ibuprofen and tylenol every  3 hours. -Throat lozenges if help -New toothbrush in 3 days - azithromycin (ZITHROMAX Z-PAK) 250 MG tablet; As directed  Dispense: 1 each; Refill: 0 - fluticasone (FLONASE) 50 MCG/ACT nasal spray; Place  2 sprays into both nostrils daily.  Dispense: 16 g; Refill: 6  Jannifer Rodney, FNP

## 2015-11-15 ENCOUNTER — Ambulatory Visit (INDEPENDENT_AMBULATORY_CARE_PROVIDER_SITE_OTHER): Payer: BC Managed Care – PPO | Admitting: Family Medicine

## 2015-11-15 ENCOUNTER — Encounter: Payer: Self-pay | Admitting: Family Medicine

## 2015-11-15 VITALS — BP 118/84 | HR 59 | Temp 97.0°F | Ht 70.0 in | Wt 185.0 lb

## 2015-11-15 DIAGNOSIS — N400 Enlarged prostate without lower urinary tract symptoms: Secondary | ICD-10-CM

## 2015-11-15 DIAGNOSIS — Z0000 Encounter for general adult medical examination without abnormal findings: Secondary | ICD-10-CM

## 2015-11-15 DIAGNOSIS — E559 Vitamin D deficiency, unspecified: Secondary | ICD-10-CM

## 2015-11-15 DIAGNOSIS — E7800 Pure hypercholesterolemia, unspecified: Secondary | ICD-10-CM

## 2015-11-15 DIAGNOSIS — Z23 Encounter for immunization: Secondary | ICD-10-CM | POA: Diagnosis not present

## 2015-11-15 DIAGNOSIS — J301 Allergic rhinitis due to pollen: Secondary | ICD-10-CM

## 2015-11-15 DIAGNOSIS — K4090 Unilateral inguinal hernia, without obstruction or gangrene, not specified as recurrent: Secondary | ICD-10-CM

## 2015-11-15 LAB — MICROSCOPIC EXAMINATION
BACTERIA UA: NONE SEEN
EPITHELIAL CELLS (NON RENAL): NONE SEEN /HPF (ref 0–10)
RBC, UA: NONE SEEN /hpf (ref 0–?)
WBC UA: NONE SEEN /HPF (ref 0–?)

## 2015-11-15 LAB — URINALYSIS, COMPLETE
Bilirubin, UA: NEGATIVE
Glucose, UA: NEGATIVE
Ketones, UA: NEGATIVE
Leukocytes, UA: NEGATIVE
Nitrite, UA: NEGATIVE
PH UA: 6.5 (ref 5.0–7.5)
PROTEIN UA: NEGATIVE
RBC, UA: NEGATIVE
Specific Gravity, UA: 1.02 (ref 1.005–1.030)
UUROB: 0.2 mg/dL (ref 0.2–1.0)

## 2015-11-15 NOTE — Patient Instructions (Addendum)
Continue current medications. Continue good therapeutic lifestyle changes which include good diet and exercise. Fall precautions discussed with patient. If an FOBT was given today- please return it to our front desk. If you are over 52 years old - you may need Prevnar 13 or the adult Pneumonia vaccine.  **Flu shots are available--- please call and schedule a FLU-CLINIC appointment**  After your visit with us today you will receive a survey in the mail or online from [employer] regarding your care with us. Please take a moment to fill this out. Your feedback is very important to us as you can help us better understand your patient needs as well as improve your experience and satisfaction. WE CARE ABOUT YOU!!!   The flu shot that you received today may make your arm sore Continue to drink plenty of fluids If you develop any discomfort in the left inguinal area please get in touch with us and we will have you see the surgeon for further evaluation of the hernia Remember to use saline nose spray in addition to the Flonase Keep the house as cool as possible Do not use any overhead fans

## 2015-11-15 NOTE — Progress Notes (Signed)
Subjective:    Patient ID: Kyle Andrade, male    DOB: 1965/01/30, 50 y.o.   MRN: [id number]  HPI Patient is here today for annual wellness exam and follow up of chronic medical problems which includes hyperlipidemia. He is taking medications regularly.Kyle Andrade is doing well overall with no particular complaints or problems. He is due today to return an FOBT get lab work get his flu shot and check a urinalysis along with an EKG. His vital signs are stable his blood pressure runs on the low side. The patient is doing well overall. His mother and father and siblings are all living and in good health except his dad has COPD and diabetes. Nothing else has changed in the family history. The patient denies any chest pain shortness of breath trouble swallowing heartburn indigestion nausea vomiting diarrhea or blood in the stool abdominal pain or change in bowel habits. He is passing his water well without problems with no burning pain or frequency. He does have nocturia 1. He is due to get an eye exam and his wife works for an ophthalmologist and he plans to do this.    Patient Active Problem List   Diagnosis Date Noted  . Allergic rhinitis 11/08/2013  . First degree hemorrhoids 11/08/2013  . Right inguinal hernia 11/08/2013  . Hyperlipidemia 05/04/2012  . Screening 05/04/2012  . Erectile dysfunction 05/04/2012  . 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 Bundle branch block, unspecified   . BPH (benign prostatic hyperplasia)    Outpatient Encounter Prescriptions as of 11/15/2015  Medication Sig  . aspirin 81 MG chewable tablet Chew 81 mg by mouth daily.  . Azelastine HCl 0.15 % SOLN USE 1 SPRAYS INTO EACH NOSTRIL AS NEEDED FOR NASAL CONGESTION  . Cholecalciferol (VITAMIN D) 2000 UNITS CAPS Take 1 capsule by mouth daily.  . fluticasone (FLONASE) 50 MCG/ACT nasal spray Place 2 sprays into both nostrils daily.  . Ibuprofen (ADVIL PO) Take by mouth.  . omega-3 acid ethyl esters  (LOVAZA) 1 G capsule TAKE 4 CAPSULES DAILY AS DIRECTED  . [DISCONTINUED] azithromycin (ZITHROMAX Z-PAK) 250 MG tablet As directed  . [DISCONTINUED] fluticasone (FLONASE) 50 MCG/ACT nasal spray 1 SPRAY IN EACH NOSTRIL ONCE A DAY   No facility-administered encounter medications on file as of 11/15/2015.       Review of Systems  Constitutional: Negative.   HENT: Negative.   Eyes: Negative.   Respiratory: Negative.   Cardiovascular: Negative.   Gastrointestinal: Negative.   Endocrine: Negative.   Genitourinary: Negative.   Musculoskeletal: Negative.   Skin: Negative.   Allergic/Immunologic: Negative.   Neurological: Negative.   Hematological: Negative.   Psychiatric/Behavioral: Negative.        Objective:   Physical Exam  Constitutional: He is oriented to person, place, and time. He appears well-developed and well-nourished. No distress.  HENT:  Head: Normocephalic and atraumatic.  Right Ear: External ear normal.  Mouth/Throat: Oropharynx is clear and moist. No oropharyngeal exudate.  Ears cerumen left ear canal will be irrigated before the patient leaves office. Nasal congestion and turbinate swelling left greater than right  Eyes: Conjunctivae and EOM are normal. Pupils are equal, round, and reactive to light. Right eye exhibits no discharge. Left eye exhibits no discharge. No scleral icterus.  Neck: Normal range of motion. Neck supple. No thyromegaly present.  No bruits thyromegaly or anterior cervical adenopathy  Cardiovascular: Normal rate, regular rhythm and intact distal pulses.   No murmur heard. Heart has a  regular rate and rhythm at 60/m  Pulmonary/Chest: Effort normal and breath sounds normal. No respiratory distress. He has no wheezes. He has no rales. He exhibits no tenderness.  Clear anteriorly and posteriorly  Abdominal: Soft. Bowel sounds are normal. He exhibits no mass. There is no tenderness. There is no rebound and no guarding.  No liver or spleen enlargement  masses or bruits or inguinal adenopathy  Genitourinary: Rectum normal, prostate normal and penis normal.  Genitourinary Comments: The prostate felt normal today I did not feel any lumps or masses and the prostate or rectal area. The patient still has a small left inguinal hernia which she is not having any problems with. The external genitalia were normal.  Musculoskeletal: Normal range of motion. He exhibits no edema or tenderness.  Lymphadenopathy:    He has no cervical adenopathy.  Neurological: He is alert and oriented to person, place, and time. He has normal reflexes. No cranial nerve deficit.  Skin: Skin is warm and dry. No rash noted.  Psychiatric: He has a normal mood and affect. His behavior is normal. Judgment and thought content normal.  Nursing note and vitals reviewed.  BP (!) 95/58 (BP Location: Left Arm)   Pulse (!) 59   Temp 97 F (36.1 C) (Oral)   Ht 5' 10" (1.778 m)   Wt 185 lb (83.9 kg)   BMI 26.54 kg/m   A chest x-ray and EKG will be done today as part of his routine physical. These results are not available at the time this note was dictated.      Assessment & Plan:  1. Annual physical exam -The patient is doing well and will receive his flu shot today. - Urinalysis, Complete - BMP8+EGFR - CBC with Differential/Platelet - Hepatic function panel - VITAMIN D 25 Hydroxy (Vit-D Deficiency, Fractures) - NMR, lipoprofile - PSA, total and free - EKG 12-Lead  2. Pure hypercholesterolemia -Continue with current treatment and aggressive therapeutic lifestyle changes pending results of lab work - BMP8+EGFR - CBC with Differential/Platelet - Hepatic function panel - NMR, lipoprofile - EKG 12-Lead  3. Vitamin D deficiency -Continue with current treatment pending results of lab work - CBC with Differential/Platelet - VITAMIN D 25 Hydroxy (Vit-D Deficiency, Fractures)  4. Benign prostatic hyperplasia, unspecified whether lower urinary tract symptoms  present -The prostate did not feel to be enlarged today and there were no lumps or masses and there were no rectal masses. - Urinalysis, Complete - CBC with Differential/Platelet - PSA, total and free  5. Left inguinal hernia -We will continue to monitor this from year to year and the patient understands to get in touch with [place] if he develops any aching or acute pain.  6. Acute nonseasonal allergic rhinitis due to pollen -Continue with Flonase and nasal saline as directed  Patient Instructions  Continue current medications. Continue good therapeutic lifestyle changes which include good diet and exercise. Fall precautions discussed with patient. If an FOBT was given today- please return it to our front desk. If you are over 84 years old - you may need Prevnar 65 or the adult Pneumonia vaccine.  **Flu shots are available--- please call and schedule a FLU-CLINIC appointment**  After your visit with [place] today you will receive a survey in the mail or online from [employer] regarding your care with [place]. Please take a moment to fill this out. Your feedback is very important to [place] as you can help [place] better understand your patient needs as well as  improve your experience and satisfaction. WE CARE ABOUT YOU!!!   The flu shot that you received today may make your arm sore Continue to drink plenty of fluids If you develop any discomfort in the left inguinal area please get in touch with [place] and we will have you see the surgeon for further evaluation of the hernia Remember to use saline nose spray in addition to the Flonase Keep the house as cool as possible Do not use any overhead fans    Arrie Senate MD

## 2015-11-16 LAB — CBC WITH DIFFERENTIAL/PLATELET
Basophils Absolute: 0 10*3/uL (ref 0.0–0.2)
Basos: 1 %
EOS (ABSOLUTE): 0.1 10*3/uL (ref 0.0–0.4)
EOS: 2 %
HEMATOCRIT: 43.2 % (ref 37.5–51.0)
HEMOGLOBIN: 15 g/dL (ref 12.6–17.7)
IMMATURE GRANS (ABS): 0 10*3/uL (ref 0.0–0.1)
IMMATURE GRANULOCYTES: 0 %
LYMPHS: 30 %
Lymphocytes Absolute: 1.2 10*3/uL (ref 0.7–3.1)
MCH: 30.5 pg (ref 26.6–33.0)
MCHC: 34.7 g/dL (ref 31.5–35.7)
MCV: 88 fL (ref 79–97)
MONOCYTES: 14 %
Monocytes Absolute: 0.5 10*3/uL (ref 0.1–0.9)
NEUTROS PCT: 53 %
Neutrophils Absolute: 2.2 10*3/uL (ref 1.4–7.0)
Platelets: 165 10*3/uL (ref 150–379)
RBC: 4.91 x10E6/uL (ref 4.14–5.80)
RDW: 12.5 % (ref 12.3–15.4)
WBC: 4 10*3/uL (ref 3.4–10.8)

## 2015-11-16 LAB — BMP8+EGFR
BUN / CREAT RATIO: 11 (ref 9–20)
BUN: 12 mg/dL (ref 6–24)
CO2: 27 mmol/L (ref 18–29)
CREATININE: 1.13 mg/dL (ref 0.76–1.27)
Calcium: 9.2 mg/dL (ref 8.7–10.2)
Chloride: 102 mmol/L (ref 96–106)
GFR calc Af Amer: 86 mL/min/{1.73_m2} (ref >59)
GFR calc non Af Amer: 74 mL/min/{1.73_m2} (ref >59)
GLUCOSE: 90 mg/dL (ref 65–99)
Potassium: 4 mmol/L (ref 3.5–5.2)
Sodium: 143 mmol/L (ref 134–144)

## 2015-11-16 LAB — NMR, LIPOPROFILE
CHOLESTEROL: 192 mg/dL (ref 100–199)
HDL Cholesterol by NMR: 38 mg/dL — ABNORMAL LOW (ref >39)
HDL PARTICLE NUMBER: 22.3 umol/L — AB (ref >=30.5)
LDL Particle Number: 1531 nmol/L — ABNORMAL HIGH (ref <1000)
LDL SIZE: 20.9 nm (ref >20.5)
LDL-C: 136 mg/dL — AB (ref 0–99)
LP-IR SCORE: 44 (ref <=45)
Small LDL Particle Number: 454 nmol/L (ref <=527)
TRIGLYCERIDES BY NMR: 89 mg/dL (ref 0–149)

## 2015-11-16 LAB — PSA, TOTAL AND FREE
PSA, Free Pct: 26.7 %
PSA, Free: 0.24 ng/mL
Prostate Specific Ag, Serum: 0.9 ng/mL (ref 0.0–4.0)

## 2015-11-16 LAB — HEPATIC FUNCTION PANEL
ALBUMIN: 4.5 g/dL (ref 3.5–5.5)
ALK PHOS: 66 IU/L (ref 39–117)
ALT: 31 IU/L (ref 0–44)
AST: 9 IU/L (ref 0–40)
BILIRUBIN TOTAL: 0.4 mg/dL (ref 0.0–1.2)
Bilirubin, Direct: 0.09 mg/dL (ref 0.00–0.40)
Total Protein: 6.6 g/dL (ref 6.0–8.5)

## 2015-11-16 LAB — VITAMIN D 25 HYDROXY (VIT D DEFICIENCY, FRACTURES): Vit D, 25-Hydroxy: 59.5 ng/mL (ref 30.0–100.0)

## 2015-11-17 MED ORDER — PITAVASTATIN CALCIUM 2 MG PO TABS: 2.0000 mg | ORAL_TABLET | Freq: Every day | ORAL | 5 refills | Status: DC

## 2015-11-17 NOTE — Addendum Note (Signed)
Addended by: Tamera PuntWRAY, Maleni Seyer S on: 11/17/2015 01:41 PM   Modules accepted: Orders

## 2015-11-22 ENCOUNTER — Other Ambulatory Visit: Payer: Self-pay | Admitting: Family Medicine

## 2016-10-11 ENCOUNTER — Encounter: Payer: Self-pay | Admitting: *Deleted

## 2016-10-14 ENCOUNTER — Other Ambulatory Visit: Payer: Self-pay | Admitting: Family Medicine

## 2016-10-15 NOTE — Telephone Encounter (Signed)
Last lipid 11/15/15  DWM

## 2016-11-12 ENCOUNTER — Ambulatory Visit (INDEPENDENT_AMBULATORY_CARE_PROVIDER_SITE_OTHER): Payer: BC Managed Care – PPO | Admitting: *Deleted

## 2016-11-12 DIAGNOSIS — Z23 Encounter for immunization: Secondary | ICD-10-CM

## 2016-11-20 ENCOUNTER — Ambulatory Visit: Payer: BC Managed Care – PPO | Admitting: Family Medicine

## 2016-11-26 ENCOUNTER — Encounter: Payer: Self-pay | Admitting: Family Medicine

## 2016-11-26 ENCOUNTER — Ambulatory Visit (INDEPENDENT_AMBULATORY_CARE_PROVIDER_SITE_OTHER): Payer: BC Managed Care – PPO | Admitting: Family Medicine

## 2016-11-26 ENCOUNTER — Ambulatory Visit (INDEPENDENT_AMBULATORY_CARE_PROVIDER_SITE_OTHER): Payer: BC Managed Care – PPO

## 2016-11-26 ENCOUNTER — Other Ambulatory Visit: Payer: Self-pay | Admitting: Family Medicine

## 2016-11-26 VITALS — BP 113/75 | HR 68 | Temp 97.7°F | Ht 70.0 in | Wt 182.0 lb

## 2016-11-26 DIAGNOSIS — K4090 Unilateral inguinal hernia, without obstruction or gangrene, not specified as recurrent: Secondary | ICD-10-CM

## 2016-11-26 DIAGNOSIS — Z0000 Encounter for general adult medical examination without abnormal findings: Secondary | ICD-10-CM

## 2016-11-26 DIAGNOSIS — H6122 Impacted cerumen, left ear: Secondary | ICD-10-CM

## 2016-11-26 DIAGNOSIS — K640 First degree hemorrhoids: Secondary | ICD-10-CM

## 2016-11-26 DIAGNOSIS — E7800 Pure hypercholesterolemia, unspecified: Secondary | ICD-10-CM | POA: Diagnosis not present

## 2016-11-26 DIAGNOSIS — Z8679 Personal history of other diseases of the circulatory system: Secondary | ICD-10-CM

## 2016-11-26 DIAGNOSIS — H3560 Retinal hemorrhage, unspecified eye: Secondary | ICD-10-CM

## 2016-11-26 DIAGNOSIS — E559 Vitamin D deficiency, unspecified: Secondary | ICD-10-CM

## 2016-11-26 DIAGNOSIS — N400 Enlarged prostate without lower urinary tract symptoms: Secondary | ICD-10-CM

## 2016-11-26 LAB — URINALYSIS, COMPLETE
Bilirubin, UA: NEGATIVE
GLUCOSE, UA: NEGATIVE
Ketones, UA: NEGATIVE
Leukocytes, UA: NEGATIVE
NITRITE UA: NEGATIVE
PH UA: 6 (ref 5.0–7.5)
Protein, UA: NEGATIVE
RBC, UA: NEGATIVE
Specific Gravity, UA: 1.025 (ref 1.005–1.030)
UUROB: 0.2 mg/dL (ref 0.2–1.0)

## 2016-11-26 LAB — MICROSCOPIC EXAMINATION
BACTERIA UA: NONE SEEN
EPITHELIAL CELLS (NON RENAL): NONE SEEN /HPF (ref 0–10)
RBC, UA: NONE SEEN /hpf (ref 0–?)
RENAL EPITHEL UA: NONE SEEN /HPF
WBC UA: NONE SEEN /HPF (ref 0–?)

## 2016-11-26 LAB — BAYER DCA HB A1C WAIVED: HB A1C: 5.3 % (ref <7.0)

## 2016-11-26 NOTE — Patient Instructions (Addendum)
Continue current medications. Continue good therapeutic lifestyle changes which include good diet and exercise. Fall precautions discussed with patient. If an FOBT was given today- please return it to our front desk. If you are over [id number] years old - you may need Prevnar 13 or the adult Pneumonia vaccine.  **Flu shots are available--- please call and schedule a FLU-CLINIC appointment**  After your visit with us today you will receive a survey in the mail or online from [employer] regarding your care with us. Please take a moment to fill this out. Your feedback is very important to us as you can help us better understand your patient needs as well as improve your experience and satisfaction. WE CARE ABOUT YOU!!!  We will arrange for you to see the cardiologist because of your history of rheumatic fever. Continue to stay active physically and eat healthy Drink plenty of fluids and stay well-hydrated

## 2016-11-26 NOTE — Progress Notes (Signed)
Subjective:    Patient ID: Kyle Andrade, male    DOB: [birth year], 51 y.o.   MRN: [id number]  HPI Patient is here today for annual wellness exam and follow up of chronic medical problems which includes hyperlipidemia and hypertension. He is taking medication regularly.  [name] is doing well with no specific complaints.  He is already had his flu shot.  He will get lab work chest x-ray and urinalysis today.  He had an EKG last year.  His weight is down 3 pounds and his vital signs are stable.  She is doing well with no complaints.  He does have a history of rheumatic heart disease but is having no problems with chest pain or shortness of breath.  He last saw the cardiologist and had an echocardiogram almost 5 years ago and we are going to recommend that he has a follow-up with cardiology.  The patient denies any trouble with swallowing heartburn indigestion nausea vomiting diarrhea or blood in the stool.  There is been no change in bowel habits.  He was told at his last colonoscopy that he would need a repeat in 10 years.  There is been no change GI wise with any of his siblings.  He has no complaints and his bowel habits are normal.  He is passing his water without problems and his sex life is fine.  He is seen the ophthalmologist recently and there is a note that will be scanned into the record regarding this visit.     Patient Active Problem List   Diagnosis Date Noted  . Allergic rhinitis 11/08/2013  . First degree hemorrhoids 11/08/2013  . Right inguinal hernia 11/08/2013  . Hyperlipidemia 05/04/2012  . Screening 05/04/2012  . Erectile dysfunction 05/04/2012  . 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 Bundle branch block, unspecified   . BPH (benign prostatic hyperplasia)    Outpatient Encounter Medications as of 11/26/2016  Medication Sig  . aspirin 81 MG chewable tablet Chew 81 mg by mouth daily.  . Azelastine HCl 0.15 % SOLN USE 1 SPRAYS INTO EACH NOSTRIL AS NEEDED  FOR NASAL CONGESTION  . Cholecalciferol (VITAMIN D) 2000 UNITS CAPS Take 1 capsule by mouth daily.  . fluticasone (FLONASE) 50 MCG/ACT nasal spray Place 2 sprays into both nostrils daily.  Marland Kitchen omega-3 acid ethyl esters (LOVAZA) 1 g capsule TAKE 4 CAPSULES DAILY AS DIRECTED  . [DISCONTINUED] Pitavastatin Calcium 2 MG TABS Take 1 tablet (2 mg total) by mouth daily.  . Ibuprofen (ADVIL PO) Take by mouth.   No facility-administered encounter medications on file as of 11/26/2016.       Review of Systems  Constitutional: Negative.   HENT: Negative.   Eyes: Negative.   Respiratory: Negative.   Cardiovascular: Negative.   Gastrointestinal: Negative.   Endocrine: Negative.   Genitourinary: Negative.   Musculoskeletal: Negative.   Skin: Negative.   Allergic/Immunologic: Negative.   Neurological: Negative.   Hematological: Negative.   Psychiatric/Behavioral: Negative.        Objective:   Physical Exam  Constitutional: He is oriented to person, place, and time. He appears well-developed and well-nourished. No distress.  Patient is pleasant and alert  HENT:  Head: Normocephalic and atraumatic.  Right Ear: External ear normal.  Nose: Nose normal.  Mouth/Throat: Oropharynx is clear and moist. No oropharyngeal exudate.  Wax left ear canal to be irrigated by nurse to remove this.  Eyes: Conjunctivae and EOM are normal. Pupils are equal, round, and  reactive to light. Right eye exhibits no discharge. Left eye exhibits no discharge. No scleral icterus.  Continue to follow-up with ophthalmologist as recommended  Neck: Normal range of motion. Neck supple. No thyromegaly present.  No bruits thyromegaly or anterior cervical adenopathy  Cardiovascular: Normal rate, regular rhythm, normal heart sounds and intact distal pulses.  No murmur heard. Regular rate and rhythm with no murmurs at 72/min  Pulmonary/Chest: Effort normal and breath sounds normal. No respiratory distress. He has no wheezes. He  has no rales. He exhibits no tenderness.  No axillary adenopathy and no chest wall tenderness and lungs are clear anteriorly and posteriorly  Abdominal: Soft. Bowel sounds are normal. He exhibits no mass. There is no tenderness. There is no rebound and no guarding.  No abdominal tenderness masses organs enlargement or bruits and no inguinal adenopathy  Genitourinary: Rectum normal and penis normal.  Genitourinary Comments: There is a small left inguinal hernia.  The patient is not having any symptoms with this.  We will continue to monitor this.  The prostate was slightly enlarged without lumps or masses.  There were no rectal masses.  The external genitalia were normal.  Musculoskeletal: Normal range of motion. He exhibits no edema or tenderness.  Lymphadenopathy:    He has no cervical adenopathy.  Neurological: He is alert and oriented to person, place, and time. He has normal reflexes. No cranial nerve deficit.  Skin: Skin is warm and dry. No rash noted.  Psychiatric: He has a normal mood and affect. His behavior is normal. Judgment and thought content normal.  Nursing note and vitals reviewed.   BP 113/75 (BP Location: Left Arm)   Pulse 68   Temp 97.7 F (36.5 C) (Oral)   Ht '5\' 10"'$  (1.778 m)   Wt 182 lb (82.6 kg)   BMI 26.11 kg/m        Assessment & Plan:  1. Annual physical exam -Return the FOBT card - BMP8+EGFR - CBC with Differential/Platelet - NMR, lipoprofile - PSA, total and free - Thyroid Panel With TSH - VITAMIN D 25 Hydroxy (Vit-D Deficiency, Fractures) - Hepatic function panel - Urinalysis, Complete - DG Chest 2 View; Future - Bayer DCA Hb A1c Waived  2. Pure hypercholesterolemia -Continue with omega-3 fatty acids and aggressive therapeutic lifestyle changes - BMP8+EGFR - CBC with Differential/Platelet - NMR, lipoprofile - DG Chest 2 View; Future - Ambulatory referral to Cardiology  3. Vitamin D deficiency -Continue with vitamin D replacement pending  results of lab work - CBC with Differential/Platelet - VITAMIN D 25 Hydroxy (Vit-D Deficiency, Fractures)  4. Benign prostatic hyperplasia, unspecified whether lower urinary tract symptoms present -The prostate was slightly enlarged and the patient is having a somewhat slow stream at times and we will continue to monitor this. - CBC with Differential/Platelet - PSA, total and free - Urinalysis, Complete  5. Healthcare maintenance - Bayer DCA Hb A1c Waived  6. Retinal hemorrhage, unspecified laterality - Bayer DCA Hb A1c Waived  7. History of rheumatic fever - Ambulatory referral to Cardiology  8. Left inguinal hernia -Patient was made aware of this finding again and he was informed that if he developed any persistent pain in this area he should get back in touch with [place] or go to the emergency room immediately.  9. First degree hemorrhoids -No problems with these.   Patient Instructions  Continue current medications. Continue good therapeutic lifestyle changes which include good diet and exercise. Fall precautions discussed with patient. If an FOBT  was given today- please return it to our front desk. If you are over 74 years old - you may need Prevnar 69 or the adult Pneumonia vaccine.  **Flu shots are available--- please call and schedule a FLU-CLINIC appointment**  After your visit with [place] today you will receive a survey in the mail or online from [employer] regarding your care with [place]. Please take a moment to fill this out. Your feedback is very important to [place] as you can help [place] better understand your patient needs as well as improve your experience and satisfaction. WE CARE ABOUT YOU!!!  We will arrange for you to see the cardiologist because of your history of rheumatic fever. Continue to stay active physically and eat healthy Drink plenty of fluids and stay well-hydrated   Arrie Senate MD

## 2016-11-27 ENCOUNTER — Other Ambulatory Visit: Payer: Self-pay | Admitting: *Deleted

## 2016-11-27 LAB — HEPATIC FUNCTION PANEL
ALBUMIN: 4.5 g/dL (ref 3.5–5.5)
ALT: 19 IU/L (ref 0–44)
AST: 10 IU/L (ref 0–40)
Alkaline Phosphatase: 59 IU/L (ref 39–117)
BILIRUBIN TOTAL: 0.7 mg/dL (ref 0.0–1.2)
BILIRUBIN, DIRECT: 0.16 mg/dL (ref 0.00–0.40)
Total Protein: 6.7 g/dL (ref 6.0–8.5)

## 2016-11-27 LAB — CBC WITH DIFFERENTIAL/PLATELET
Basophils Absolute: 0 10*3/uL (ref 0.0–0.2)
Basos: 1 %
EOS (ABSOLUTE): 0.1 10*3/uL (ref 0.0–0.4)
EOS: 2 %
HEMATOCRIT: 44 % (ref 37.5–51.0)
HEMOGLOBIN: 15.1 g/dL (ref 13.0–17.7)
IMMATURE GRANULOCYTES: 0 %
Immature Grans (Abs): 0 10*3/uL (ref 0.0–0.1)
LYMPHS ABS: 1.5 10*3/uL (ref 0.7–3.1)
Lymphs: 38 %
MCH: 30.6 pg (ref 26.6–33.0)
MCHC: 34.3 g/dL (ref 31.5–35.7)
MCV: 89 fL (ref 79–97)
MONOCYTES: 8 %
Monocytes Absolute: 0.3 10*3/uL (ref 0.1–0.9)
Neutrophils Absolute: 2.1 10*3/uL (ref 1.4–7.0)
Neutrophils: 51 %
Platelets: 160 10*3/uL (ref 150–379)
RBC: 4.93 x10E6/uL (ref 4.14–5.80)
RDW: 13.3 % (ref 12.3–15.4)
WBC: 4 10*3/uL (ref 3.4–10.8)

## 2016-11-27 LAB — BMP8+EGFR
BUN / CREAT RATIO: 12 (ref 9–20)
BUN: 14 mg/dL (ref 6–24)
CO2: 24 mmol/L (ref 20–29)
CREATININE: 1.18 mg/dL (ref 0.76–1.27)
Calcium: 9.6 mg/dL (ref 8.7–10.2)
Chloride: 104 mmol/L (ref 96–106)
GFR calc Af Amer: 81 mL/min/{1.73_m2} (ref >59)
GFR calc non Af Amer: 70 mL/min/{1.73_m2} (ref >59)
GLUCOSE: 88 mg/dL (ref 65–99)
Potassium: 4.4 mmol/L (ref 3.5–5.2)
Sodium: 143 mmol/L (ref 134–144)

## 2016-11-27 LAB — NMR, LIPOPROFILE
CHOLESTEROL: 197 mg/dL (ref 100–199)
HDL CHOLESTEROL BY NMR: 38 mg/dL — AB (ref >39)
HDL PARTICLE NUMBER: 25.9 umol/L — AB (ref >=30.5)
LDL Particle Number: 1777 nmol/L — ABNORMAL HIGH (ref <1000)
LDL SIZE: 21.3 nm (ref >20.5)
LDL-C: 144 mg/dL — AB (ref 0–99)
LP-IR SCORE: 34 (ref <=45)
Small LDL Particle Number: 576 nmol/L — ABNORMAL HIGH (ref <=527)
TRIGLYCERIDES BY NMR: 77 mg/dL (ref 0–149)

## 2016-11-27 LAB — THYROID PANEL WITH TSH
FREE THYROXINE INDEX: 1.5 (ref 1.2–4.9)
T3 Uptake Ratio: 24 % (ref 24–39)
T4, Total: 6.3 ug/dL (ref 4.5–12.0)
TSH: 2.2 u[IU]/mL (ref 0.450–4.500)

## 2016-11-27 LAB — PSA, TOTAL AND FREE
PROSTATE SPECIFIC AG, SERUM: 0.9 ng/mL (ref 0.0–4.0)
PSA FREE PCT: 30 %
PSA, Free: 0.27 ng/mL

## 2016-11-27 LAB — VITAMIN D 25 HYDROXY (VIT D DEFICIENCY, FRACTURES): Vit D, 25-Hydroxy: 55.3 ng/mL (ref 30.0–100.0)

## 2016-11-27 MED ORDER — PITAVASTATIN CALCIUM 4 MG PO TABS: 1.0000 | ORAL_TABLET | Freq: Every day | ORAL | 11 refills | Status: DC

## 2016-12-13 ENCOUNTER — Other Ambulatory Visit: Payer: Self-pay | Admitting: Family Medicine

## 2017-01-02 ENCOUNTER — Telehealth: Payer: Self-pay

## 2017-01-02 NOTE — Telephone Encounter (Signed)
Please find out what medicines he has tried that he did not tolerate.  Also find out if he tolerated the Livalo or not.

## 2017-01-02 NOTE — Telephone Encounter (Signed)
INsurance denied Livalo

## 2017-01-02 NOTE — Telephone Encounter (Signed)
Please do a prior authorization

## 2017-01-02 NOTE — Telephone Encounter (Signed)
Pt has allergy / intolerance to 2 other meds including simvastatin and Lipitor --- these are on his allergy list -- caused myalgias / aches.  The livalo works well and he has been on it (with a free trial coupon for the last month)   He would like to continue it - can we so a PA ?

## 2017-01-08 NOTE — Progress Notes (Signed)
Cardiology Office Note   Date:  01/10/2017   ID:  Kyle SilvasRandy A Benassi, DOB 06-09-1965, MRN [id number]  PCP:  Ernestina PennaMoore, Donald W, MD  Cardiologist:   No primary care provider on file. Referring:  Ernestina PennaMoore, Donald W, MD  Chief Complaint  Patient presents with  . Coronary Calcium      History of Present Illness: Kyle Andrade is a 51 y.o. male who had a CT in 2014 and was found to have elevated coronary calcium with non obstructive CAD.  He is referred by Ernestina PennaMoore, Donald W, MD for evaluation of this.   I reviewed the hospital records from 2014.  He presented with chest discomfort.  They did a coronary CT.  He had a calcium score of 122 which was 93rd percentile for age.  There was a 20% LAD stenosis.  He was not thought to have anginal etiology to.  He has since had the same kind of discomfort periodically but only at night.  It happens when he is lying flat.  He gets up and takes milk and Advil and his symptoms go away.  It happens every 3-4 weeks.  It may be 6 out of 10 in intensity.  It is unchanged from his previous discomfort.  He exercises routinely and he did a lot of shoveling snow recently and has no symptoms.  He denies any shortness of breath, PND or orthopnea.  Has no palpitations, presyncope or syncope.  Of note I also see an echocardiogram from 2004 which was essentially unremarkable except his EF was low normal at 50-55%.  Past Medical History:  Diagnosis Date  . Chest discomfort 03/2012   Sent to ED, had CT scan & was sent home. Mild coronary calcification in the LAD was noted on CT scan.  . Coronary atherosclerosis due to calcified coronary lesion(414.4)   . Hyperlipidemia   . Hyperplasia of prostate   . 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 SOB (shortness of breath) 03/2012   Prior to CP as well  . Unspecified hemorrhoids without mention of complication   . Vitamin D deficiency     Past Surgical History:  Procedure Laterality Date  . URETHRA SURGERY  as  a child     Current Outpatient Medications  Medication Sig Dispense Refill  . aspirin 81 MG chewable tablet Chew 81 mg by mouth daily.    . Azelastine HCl 0.15 % SOLN USE 1 SPRAYS INTO EACH NOSTRIL AS NEEDED FOR NASAL CONGESTION 30 mL 3  . Cholecalciferol (VITAMIN D) 2000 UNITS CAPS Take 1 capsule by mouth daily.    . fluticasone (FLONASE) 50 MCG/ACT nasal spray Place 2 sprays into both nostrils daily. 16 g 6  . Ibuprofen (ADVIL PO) Take by mouth.    . omega-3 acid ethyl esters (LOVAZA) 1 g capsule TAKE 4 CAPSULES DAILY AS DIRECTED 120 capsule 0  . Pitavastatin Calcium (LIVALO) 4 MG TABS Take 1 tablet (4 mg total) daily by mouth. As directed 30 tablet 11   No current facility-administered medications for this visit.     Allergies:   Simvastatin and Lipitor [atorvastatin calcium]    Social History:  The patient  reports that  has never smoked. he has never used smokeless tobacco. He reports that he drinks about 1.2 oz of alcohol per week. He reports that he does not use drugs.   Family History:  The patient's family history includes CVA in his maternal grandfather and paternal grandfather; Diabetes in  his father; Emphysema in his father; Healthy in his mother.    ROS:  Please see the history of present illness.   Otherwise, review of systems are positive for none.   All other systems are reviewed and negative.    PHYSICAL EXAM: VS:  BP 112/80   Pulse 62   Ht 5\' 10"  (1.778 m)   Wt 184 lb (83.5 kg)   BMI 26.40 kg/m  , BMI Body mass index is 26.4 kg/m. GENERAL:  Well appearing HEENT:  Pupils equal round and reactive, fundi not visualized, oral mucosa unremarkable NECK:  No jugular venous distention, waveform within normal limits, carotid upstroke brisk and symmetric, no bruits, no thyromegaly LYMPHATICS:  No cervical, inguinal adenopathy LUNGS:  Clear to auscultation bilaterally BACK:  No CVA tenderness CHEST:  Unremarkable HEART:  PMI not displaced or sustained,S1 and S2  within normal limits, no S3, no S4, no clicks, no rubs, no murmurs ABD:  Flat, positive bowel sounds normal in frequency in pitch, no bruits, no rebound, no guarding, no midline pulsatile mass, no hepatomegaly, no splenomegaly EXT:  2 plus pulses throughout, no edema, no cyanosis no clubbing SKIN:  No rashes no nodules NEURO:  Cranial nerves II through XII grossly intact, motor grossly intact throughout PSYCH:  Cognitively intact, oriented to person place and time    EKG:  EKG is ordered today. The ekg ordered today demonstrates sinus rhythm, rate 62, axis within normal limits, intervals within normal limits, no acute ST-T wave changes.   Recent Labs: 11/26/2016: ALT 19; BUN 14; Creatinine, Ser 1.18; Hemoglobin 15.1; Platelets 160; Potassium 4.4; Sodium 143; TSH 2.200    Lipid Panel    Component Value Date/Time   CHOL 197 11/26/2016 0935   CHOL 118 05/04/2012 0914   TRIG 77 11/26/2016 0935   TRIG 80 05/04/2012 0914   HDL 38 (L) 11/26/2016 0935   HDL 37 (L) 05/04/2012 0914   LDLCALC 77 11/08/2013 0852   LDLCALC 65 05/04/2012 0914      Wt Readings from Last 3 Encounters:  01/10/17 184 lb (83.5 kg)  11/26/16 182 lb (82.6 kg)  11/15/15 185 lb (83.9 kg)      Other studies Reviewed: Additional studies/ records that were reviewed today include: ED records, CT. Review of the above records demonstrates:  Please see elsewhere in the note.     ASSESSMENT AND PLAN:  CAD: The patient had nonobstructive coronary disease.  He needs to be screened.  I will bring the patient back for a POET (Plain Old Exercise Test). This will allow me to screen for obstructive coronary disease, risk stratify and very importantly provide a prescription for exercise.  Needs aggressive risk reduction.  DYSLIPIDEMIA: He does have an abnormal lipid profile with an LDL of 147 and is being managed aggressively by Dr. Christell ConstantMoore and I agree with this and discussed this with the patient as well.  We discussed  exercise which he does routinely anyway and diet which seems to be good.   Current medicines are reviewed at length with the patient today.  The patient does not have concerns regarding medicines.  The following changes have been made:  no change  Labs/ tests ordered today include:   Orders Placed This Encounter  Procedures  . Exercise Tolerance Test  . EKG 12-Lead     Disposition:   FU with me as needed.      Signed, Rollene RotundaJames Jakie Debow, MD  01/10/2017 5:42 PM    [facility]

## 2017-01-10 ENCOUNTER — Encounter: Payer: Self-pay | Admitting: Cardiology

## 2017-01-10 ENCOUNTER — Ambulatory Visit: Payer: BC Managed Care – PPO | Admitting: Cardiology

## 2017-01-10 VITALS — BP 112/80 | HR 62 | Ht 70.0 in | Wt 184.0 lb

## 2017-01-10 DIAGNOSIS — R931 Abnormal findings on diagnostic imaging of heart and coronary circulation: Secondary | ICD-10-CM

## 2017-01-10 DIAGNOSIS — E785 Hyperlipidemia, unspecified: Secondary | ICD-10-CM | POA: Diagnosis not present

## 2017-01-10 NOTE — Patient Instructions (Signed)
Medication Instructions:  Continue current medications  If you need a refill on your cardiac medications before your next appointment, please call your pharmacy.  Labwork: None Ordered   Testing/Procedures: Your physician has requested that you have an exercise tolerance test. For further information please visit [email or web address]. Please also follow instruction sheet, as given.  Special Instructions:  Happy Holidays!!  Follow-Up: Your physician wants you to follow-up in: As Needed.    [facility] you [facility]

## 2017-01-17 ENCOUNTER — Telehealth (HOSPITAL_COMMUNITY): Payer: Self-pay

## 2017-01-17 NOTE — Telephone Encounter (Signed)
Encounter complete. 

## 2017-01-18 ENCOUNTER — Ambulatory Visit: Payer: BC Managed Care – PPO | Admitting: Physician Assistant

## 2017-01-18 VITALS — BP 94/62 | HR 78 | Temp 98.1°F | Ht 70.0 in | Wt 183.0 lb

## 2017-01-18 DIAGNOSIS — J40 Bronchitis, not specified as acute or chronic: Secondary | ICD-10-CM

## 2017-01-18 DIAGNOSIS — J0100 Acute maxillary sinusitis, unspecified: Secondary | ICD-10-CM

## 2017-01-18 MED ORDER — METHYLPREDNISOLONE ACETATE 80 MG/ML IJ SUSP
80.0000 mg | Freq: Once | INTRAMUSCULAR | Status: AC
  Administered 2017-01-18: 80 mg via INTRAMUSCULAR

## 2017-01-18 MED ORDER — DOXYCYCLINE HYCLATE 100 MG PO TABS: 100.0000 mg | ORAL_TABLET | Freq: Two times a day (BID) | ORAL | 0 refills | Status: DC

## 2017-01-20 ENCOUNTER — Other Ambulatory Visit: Payer: Self-pay | Admitting: Physician Assistant

## 2017-01-20 MED ORDER — HYDROCODONE-HOMATROPINE 5-1.5 MG/5ML PO SYRP: 5.0000 mL | ORAL_SOLUTION | Freq: Four times a day (QID) | ORAL | 0 refills | Status: DC | PRN

## 2017-01-20 NOTE — Patient Instructions (Signed)
In a few days you may receive a survey in the mail or online from [employer] regarding your visit with us today. Please take a moment to fill this out. Your feedback is very important to our whole office. It can help us better understand your needs as well as improve your experience and satisfaction. Thank you for taking your time to complete it. We care about you.  Thyra Yinger, PA-C  

## 2017-01-20 NOTE — Progress Notes (Signed)
BP 94/62 (BP Location: Right Arm, Patient Position: Sitting, Cuff Size: Large)   Pulse 78   Temp 98.1 F (36.7 C) (Oral)   Ht 5\' 10"  (1.778 m)   Wt 183 lb (83 kg)   BMI 26.26 kg/m    Subjective:    Patient ID: Kyle Andrade, male    DOB: 11/07/1965, 51 y.o.   MRN: [id number]  HPI: Kyle SilvasRandy A Andrade is a 51 y.o. male presenting on 01/18/2017 for URI (productive cough x 1 day) and Sinusitis (maxillary facial pain/pressure, teeth pain, nasal congestion x 1 week. Taking Advil and mucinex severe cough and congestion)  This patient has had many days of sinus headache and postnasal drainage. There is copious drainage at times. Denies any fever at this time. There has been a history of sinus infections in the past.  No history of sinus surgery. There is cough at night. It has become more prevalent in recent days.  Relevant past medical, surgical, family and social history reviewed and updated as indicated. Allergies and medications reviewed and updated.  Past Medical History:  Diagnosis Date  . Chest discomfort 03/2012   Sent to ED, had CT scan & was sent home. Mild coronary calcification in the LAD was noted on CT scan.  . Coronary atherosclerosis due to calcified coronary lesion(414.4)   . Hyperlipidemia   . Hyperplasia of prostate   . 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 SOB (shortness of breath) 03/2012   Prior to CP as well  . Unspecified hemorrhoids without mention of complication   . Vitamin D deficiency     Past Surgical History:  Procedure Laterality Date  . URETHRA SURGERY  as a child    Review of Systems  Constitutional: Positive for fatigue and fever. Negative for appetite change.  HENT: Positive for congestion, sinus pressure and sore throat.   Eyes: Negative.  Negative for pain and visual disturbance.  Respiratory: Positive for shortness of breath and wheezing. Negative for cough and chest tightness.   Cardiovascular: Negative.  Negative for  chest pain, palpitations and leg swelling.  Gastrointestinal: Negative.  Negative for abdominal pain, diarrhea, nausea and vomiting.  Endocrine: Negative.   Genitourinary: Negative.   Musculoskeletal: Positive for back pain and myalgias.  Skin: Negative.  Negative for color change and rash.  Neurological: Positive for headaches. Negative for weakness and numbness.  Psychiatric/Behavioral: Negative.     Allergies as of 01/18/2017      Reactions   Simvastatin    Joint pain   Lipitor [atorvastatin Calcium] Other (See Comments)   myalgias      Medication List        Accurate as of 01/18/17 11:59 PM. Always use your most recent med list.          ADVIL PO Take by mouth.   aspirin 81 MG chewable tablet Chew 81 mg by mouth daily.   Azelastine HCl 0.15 % Soln USE 1 SPRAYS INTO EACH NOSTRIL AS NEEDED FOR NASAL CONGESTION   doxycycline 100 MG tablet Commonly known as:  VIBRA-TABS Take 1 tablet (100 mg total) by mouth 2 (two) times daily.   fluticasone 50 MCG/ACT nasal spray Commonly known as:  FLONASE Place 2 sprays into both nostrils daily.   omega-3 acid ethyl esters 1 g capsule Commonly known as:  LOVAZA TAKE 4 CAPSULES DAILY AS DIRECTED   Pitavastatin Calcium 4 MG Tabs Commonly known as:  LIVALO Take 1 tablet (4 mg total) daily by  mouth. As directed   Vitamin D 2000 units Caps Take 1 capsule by mouth daily.          Objective:    BP 94/62 (BP Location: Right Arm, Patient Position: Sitting, Cuff Size: Large)   Pulse 78   Temp 98.1 F (36.7 C) (Oral)   Ht 5\' 10"  (1.778 m)   Wt 183 lb (83 kg)   BMI 26.26 kg/m   Allergies  Allergen Reactions  . Simvastatin     Joint pain  . Lipitor [Atorvastatin Calcium] Other (See Comments)    myalgias    Physical Exam  Constitutional: He is oriented to person, place, and time. He appears well-developed and well-nourished.  HENT:  Head: Normocephalic and atraumatic.  Right Ear: Tympanic membrane and external ear  normal. No middle ear effusion.  Left Ear: Tympanic membrane and external ear normal.  No middle ear effusion.  Nose: Mucosal edema and rhinorrhea present. Right sinus exhibits maxillary sinus tenderness and frontal sinus tenderness. Left sinus exhibits maxillary sinus tenderness and frontal sinus tenderness.  Mouth/Throat: Uvula is midline. Posterior oropharyngeal erythema present.  Eyes: Conjunctivae and EOM are normal. Pupils are equal, round, and reactive to light. Right eye exhibits no discharge. Left eye exhibits no discharge.  Neck: Normal range of motion.  Cardiovascular: Normal rate, regular rhythm and normal heart sounds.  Pulmonary/Chest: Effort normal and breath sounds normal. No respiratory distress. He has no wheezes.  Abdominal: Soft.  Lymphadenopathy:    He has no cervical adenopathy.  Neurological: He is alert and oriented to person, place, and time.  Skin: Skin is warm and dry.  Psychiatric: He has a normal mood and affect.        Assessment & Plan:   1. Bronchitis - doxycycline (VIBRA-TABS) 100 MG tablet; Take 1 tablet (100 mg total) by mouth 2 (two) times daily.  Dispense: 20 tablet; Refill: 0 - methylPREDNISolone acetate (DEPO-MEDROL) injection 80 mg    Current Outpatient Medications:  .  aspirin 81 MG chewable tablet, Chew 81 mg by mouth daily., Disp: , Rfl:  .  Azelastine HCl 0.15 % SOLN, USE 1 SPRAYS INTO EACH NOSTRIL AS NEEDED FOR NASAL CONGESTION, Disp: 30 mL, Rfl: 3 .  Cholecalciferol (VITAMIN D) 2000 UNITS CAPS, Take 1 capsule by mouth daily., Disp: , Rfl:  .  doxycycline (VIBRA-TABS) 100 MG tablet, Take 1 tablet (100 mg total) by mouth 2 (two) times daily., Disp: 20 tablet, Rfl: 0 .  fluticasone (FLONASE) 50 MCG/ACT nasal spray, Place 2 sprays into both nostrils daily., Disp: 16 g, Rfl: 6 .  Ibuprofen (ADVIL PO), Take by mouth., Disp: , Rfl:  .  omega-3 acid ethyl esters (LOVAZA) 1 g capsule, TAKE 4 CAPSULES DAILY AS DIRECTED, Disp: 120 capsule, Rfl: 0 .   Pitavastatin Calcium (LIVALO) 4 MG TABS, Take 1 tablet (4 mg total) daily by mouth. As directed, Disp: 30 tablet, Rfl: 11 Continue all other maintenance medications as listed above.  Follow up plan: No Follow-up on file.  Educational handout given for survey  Remus LofflerAngel S. Laithan Conchas PA-C [facility] [address]  [place], [place] [address] [phone]   01/20/2017, 9:09 AM

## 2017-01-22 ENCOUNTER — Telehealth (HOSPITAL_COMMUNITY): Payer: Self-pay

## 2017-01-22 NOTE — Telephone Encounter (Signed)
Encounter complete. 

## 2017-01-23 ENCOUNTER — Ambulatory Visit (HOSPITAL_COMMUNITY)
Admission: RE | Admit: 2017-01-23 | Discharge: 2017-01-23 | Disposition: A | Discharge status: Home / Self Care | Payer: BC Managed Care – PPO | Source: Ambulatory Visit | Attending: Cardiovascular Disease | Admitting: Cardiovascular Disease

## 2017-01-23 DIAGNOSIS — R931 Abnormal findings on diagnostic imaging of heart and coronary circulation: Secondary | ICD-10-CM | POA: Insufficient documentation

## 2017-01-23 LAB — EXERCISE TOLERANCE TEST
CHL CUP MPHR: 167 {beats}/min
CSEPHR: 113 %
CSEPPHR: 190 {beats}/min
Estimated workload: 17.3 METS
Exercise duration (min): 14 min
Exercise duration (sec): 0 s
RPE: 18
Rest HR: 61 {beats}/min

## 2017-02-14 ENCOUNTER — Other Ambulatory Visit: Payer: Self-pay | Admitting: Family Medicine

## 2017-02-25 ENCOUNTER — Telehealth: Payer: Self-pay

## 2017-02-25 NOTE — Telephone Encounter (Signed)
Appeal for Livalo was approved.  

## 2017-02-25 NOTE — Telephone Encounter (Signed)
Please let patient know.

## 2017-02-26 ENCOUNTER — Ambulatory Visit: Payer: Self-pay | Admitting: Cardiology

## 2017-03-21 ENCOUNTER — Other Ambulatory Visit: Payer: Self-pay | Admitting: Family Medicine

## 2017-05-05 ENCOUNTER — Other Ambulatory Visit: Payer: Self-pay | Admitting: Family

## 2017-05-05 DIAGNOSIS — J069 Acute upper respiratory infection, unspecified: Secondary | ICD-10-CM

## 2017-05-21 ENCOUNTER — Encounter: Payer: Self-pay | Admitting: Family Medicine

## 2017-05-21 ENCOUNTER — Ambulatory Visit: Payer: BC Managed Care – PPO | Admitting: Family Medicine

## 2017-05-21 VITALS — BP 107/73 | HR 59 | Temp 97.1°F | Ht 70.0 in | Wt 182.0 lb

## 2017-05-21 DIAGNOSIS — J209 Acute bronchitis, unspecified: Secondary | ICD-10-CM

## 2017-05-21 MED ORDER — AZITHROMYCIN 250 MG PO TABS: ORAL_TABLET | ORAL | 0 refills | Status: DC

## 2017-05-21 MED ORDER — PREDNISONE 20 MG PO TABS: 40.0000 mg | ORAL_TABLET | Freq: Every day | ORAL | 0 refills | Status: AC

## 2017-05-21 MED ORDER — ALBUTEROL SULFATE HFA 108 (90 BASE) MCG/ACT IN AERS: 2.0000 | INHALATION_SPRAY | Freq: Four times a day (QID) | RESPIRATORY_TRACT | 0 refills | Status: DC | PRN

## 2017-05-21 NOTE — Progress Notes (Signed)
Subjective: CC: Bronchitis PCP: Ernestina Penna, MD ZOX:[id number] [name] Andrade is a 52 y.o. male presenting to clinic today for:  1. Bronchitis Patient reports a 2-week history of productive cough.  He notes that symptoms started out as a sore throat then progressed to sinusitis and now has moved down into the chest.  He reports he has been using Advil and Flonase for symptoms but has not had any resolution in symptoms.  He denies any hemoptysis, fevers, chills.  He does report some dyspnea on exertion and notes that he has not been able to work out at the gym because of feeling winded.  No history of COPD or asthma.  ROS: Per HPI  Allergies  Allergen Reactions  . Simvastatin     Joint pain  . Lipitor [Atorvastatin Calcium] Other (See Comments)    myalgias   Past Medical History:  Diagnosis Date  . Chest discomfort 03/2012   Sent to ED, had CT scan & was sent home. Mild coronary calcification in the LAD was noted on CT scan.  . Coronary atherosclerosis due to calcified coronary lesion(414.4)   . Hyperlipidemia   . Hyperplasia of prostate   . 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 SOB (shortness of breath) 03/2012   Prior to CP as well  . Unspecified hemorrhoids without mention of complication   . Vitamin D deficiency     Current Outpatient Medications:  .  aspirin 81 MG chewable tablet, Chew 81 mg by mouth daily., Disp: , Rfl:  .  Azelastine HCl 0.15 % SOLN, USE 1 SPRAYS INTO EACH NOSTRIL AS NEEDED FOR NASAL CONGESTION, Disp: 30 mL, Rfl: 3 .  Cholecalciferol (VITAMIN D) 2000 UNITS CAPS, Take 1 capsule by mouth daily., Disp: , Rfl:  .  doxycycline (VIBRA-TABS) 100 MG tablet, Take 1 tablet (100 mg total) by mouth 2 (two) times daily., Disp: 20 tablet, Rfl: 0 .  fluticasone (FLONASE) 50 MCG/ACT nasal spray, USE 2 SPRAYS IN EACH NOSTRIL ONCE DAILY., Disp: 16 g, Rfl: 0 .  HYDROcodone-homatropine (HYCODAN) 5-1.5 MG/5ML syrup, Take 5-10 mLs by mouth every 6 (six) hours as  needed., Disp: 240 mL, Rfl: 0 .  Ibuprofen (ADVIL PO), Take by mouth., Disp: , Rfl:  .  omega-3 acid ethyl esters (LOVAZA) 1 g capsule, TAKE 4 CAPSULES DAILY AS DIRECTED, Disp: 120 capsule, Rfl: 3 .  Pitavastatin Calcium (LIVALO) 4 MG TABS, Take 1 tablet (4 mg total) daily by mouth. As directed, Disp: 30 tablet, Rfl: 11 Social History   Socioeconomic History  . Marital status: Married    Spouse name: [name]  . Number of children: Not on file  . Years of education: Not on file  . Highest education level: Not on file  Occupational History  . Occupation: Full time    Employer: [employer]  Social Needs  . Financial resource strain: Not on file  . Food insecurity:    Worry: Not on file    Inability: Not on file  . Transportation needs:    Medical: Not on file    Non-medical: Not on file  Tobacco Use  . Smoking status: Never Smoker  . Smokeless tobacco: Never Used  Substance and Sexual Activity  . Alcohol use: Yes    Alcohol/week: 1.2 oz    Types: 2 Cans of beer per week  . Drug use: No  . Sexual activity: Not on file  Lifestyle  . Physical activity:    Days per week: Not  on file    Minutes per session: Not on file  . Stress: Not on file  Relationships  . Social connections:    Talks on phone: Not on file    Gets together: Not on file    Attends religious service: Not on file    Active member of club or organization: Not on file    Attends meetings of clubs or organizations: Not on file    Relationship status: Not on file  . Intimate partner violence:    Fear of current or ex partner: Not on file    Emotionally abused: Not on file    Physically abused: Not on file    Forced sexual activity: Not on file  Other Topics Concern  . Not on file  Social History Narrative  . Not on file   Family History  Problem Relation Age of Onset  . Healthy Mother   . Diabetes Father   . Emphysema Father   . CVA Maternal Grandfather   . CVA Paternal Grandfather   . Colon  cancer Neg Hx     Objective: Office vital signs reviewed. BP 107/73   Pulse (!) 59   Temp (!) 97.1 F (36.2 C) (Oral)   Ht  (1.778 m)   Wt 182 lb (82.6 kg)   SpO2 97%   BMI 26.11 kg/m   Physical Examination:  General: Awake, alert, well nourished, No acute distress HEENT: Normal    Neck: No masses palpated. No lymphadenopathy    Ears: Tympanic membranes intact, normal light reflex, no erythema, no bulging    Eyes: PERRLA, extraocular membranes intact, sclera white    Nose: nasal turbinates moist, no nasal discharge    Throat: moist mucus membranes, no erythema, no tonsillar exudate.  Airway is patent Cardio: regular rate and rhythm, S1S2 heard, no murmurs appreciated Pulm: clear to auscultation bilaterally, no wheezes, rhonchi or rales; normal work of breathing on room air Extremities: Warm, well-perfused, no edema.  Assessment/ Plan: 52 y.o. male   1. Bronchospasm with bronchitis, acute Patient is afebrile and nontoxic-appearing with normal vital signs.  His pulse ox is 97% on room air.  His cardiopulmonary exam was unremarkable today.  His symptoms though suggest that he may be having bronchospasm with possibly acute bronchitis.  We discussed that there are many reasons bronchitis can occur, including viral processes, allergies and bacterial mediated.  No evidence of fluid overload.  There is nothing on exam to suggest a bacterial mediated process today.  I did give him a course of oral steroids to take over the next 5 days as well as an albuterol inhaler to help with dyspnea on exertion.  I have also given him a pocket prescription for Z-Pak should symptoms not improve substantially over the next 2 days.  Home care instructions were reviewed with the patient.  Reasons for return evaluation also discussed.  Handout provided.  Follow-up as needed.   Meds ordered this encounter  Medications  . predniSONE (DELTASONE) 20 MG tablet    Sig: Take 2 tablets (40 mg total) by mouth  daily with breakfast for 5 days.    Dispense:  10 tablet    Refill:  0  . albuterol (PROVENTIL HFA;VENTOLIN HFA) 108 (90 Base) MCG/ACT inhaler    Sig: Inhale 2 puffs into the lungs every 6 (six) hours as needed for wheezing or shortness of breath.    Dispense:  1 Inhaler    Refill:  0  . azithromycin (ZITHROMAX Z-PAK)  250 MG tablet    Sig: As directed    Dispense:  6 tablet    Refill:  0     Laneice Meneely Hulen Skains, DO [facility] [phone]

## 2017-05-21 NOTE — Patient Instructions (Signed)
As we discussed, I do not think that there is a bacterial component based on today's exam.  You very well may have had a virus or this may be allergy induced.  Either way, we should proceed with steroids for the next several days to open up your lungs and reduce bronchospasm.  I am sending you an inhaler to use as well if you are feeling short of breath or wheezy.  I have given you a pocket prescription for an antibiotic if your symptoms are not improving with the above therapies within the next 2 days.  It appears that you have a viral upper respiratory infection (cold).  Cold symptoms can last up to 2 weeks.    - Get plenty of rest and drink plenty of fluids. - Try to breathe moist air. Use a cold mist humidifier. - Consume warm fluids (soup or tea) to provide relief for a stuffy nose and to loosen phlegm. - For nasal stuffiness, try saline nasal spray or a Neti Pot. Afrin nasal spray can also be used but this product should not be used longer than 3 days or it will cause rebound nasal stuffiness (worsening nasal congestion). - For sore throat pain relief: use chloraseptic spray, suck on throat lozenges, hard candy or popsicles; gargle with warm salt water (1/4 tsp. salt per 8 oz. of water); and eat soft, bland foods. - Eat a well-balanced diet. If you cannot, ensure you are getting enough nutrients by taking a daily multivitamin. - Avoid dairy products, as they can thicken phlegm. - Avoid alcohol, as it impairs your body's immune system.  CONTACT YOUR DOCTOR IF YOU EXPERIENCE ANY OF THE FOLLOWING: - High fever - Ear pain - Sinus-type headache - Unusually severe cold symptoms - Cough that gets worse while other cold symptoms improve - Flare up of any chronic lung problem, such as asthma - Your symptoms persist longer than 2 weeks

## 2017-11-03 DIAGNOSIS — Z23 Encounter for immunization: Secondary | ICD-10-CM

## 2017-11-19 ENCOUNTER — Encounter: Payer: Self-pay | Admitting: Family Medicine

## 2017-11-19 ENCOUNTER — Ambulatory Visit: Payer: BC Managed Care – PPO | Admitting: Family Medicine

## 2017-11-19 ENCOUNTER — Other Ambulatory Visit: Payer: Self-pay | Admitting: *Deleted

## 2017-11-19 VITALS — BP 110/66 | HR 62 | Temp 97.1°F | Ht 70.0 in | Wt 182.0 lb

## 2017-11-19 DIAGNOSIS — R079 Chest pain, unspecified: Secondary | ICD-10-CM | POA: Diagnosis not present

## 2017-11-19 DIAGNOSIS — E7800 Pure hypercholesterolemia, unspecified: Secondary | ICD-10-CM

## 2017-11-19 DIAGNOSIS — N400 Enlarged prostate without lower urinary tract symptoms: Secondary | ICD-10-CM

## 2017-11-19 DIAGNOSIS — R10816 Epigastric abdominal tenderness: Secondary | ICD-10-CM | POA: Diagnosis not present

## 2017-11-19 DIAGNOSIS — E559 Vitamin D deficiency, unspecified: Secondary | ICD-10-CM

## 2017-11-19 DIAGNOSIS — Z1211 Encounter for screening for malignant neoplasm of colon: Secondary | ICD-10-CM

## 2017-11-19 DIAGNOSIS — Z0000 Encounter for general adult medical examination without abnormal findings: Secondary | ICD-10-CM

## 2017-11-19 NOTE — Addendum Note (Signed)
Addended by: Margorie John on: 11/19/2017 08:36 AM   Modules accepted: Orders

## 2017-11-19 NOTE — Patient Instructions (Signed)
The patient should take Pepcid AC to twice daily before breakfast and supper He should avoid NSAIDs alcohol and caffeine as much as possible We will discuss his case with his cardiologist because of the history We will also arrange for him to see the gastroenterologist for an endoscopy We will call with blood work results as soon as those results become available He will also return an FOBT

## 2017-11-19 NOTE — Progress Notes (Signed)
Subjective:    Patient ID: Kyle Andrade, male    DOB: 02-Oct-1965, 52 y.o.   MRN: [id number]  HPI  Patient here today for episodes of chest pain at night and possible indigestion.  Is doing well overall but continues to complain of indigestion at nighttime.  He did have a stress test in January and according to the report was an excellent result with no signs of any ischemia present.  This is been going on for several months.  We need to go ahead and get him started on medication schedule him for an endoscopy and repeat his EKG today.  He also needs to avoid all NSAIDs.  The patient also takes a baby aspirin every day.  Has a history of blockages from the past which he reminded me of.  He does see the cardiologist about every 2 years.  This particular episode happened last week and started on Tuesday and lasted until Thursday.  He continued to exercise as usual and this did not make things any worse.  He does drink coffee tea and a beer daily along with the baby aspirin.  There is no family history of heart disease.  The discomfort was described as chest pain radiating to his left arm.  There is no change in bowel habits and he only has occasional bright red blood per stool.  No black tarry bowel movements.  He is passing his water without problems.  The patient's pain was described to be radiating around the left chest from the sub-sternal area.    Patient Active Problem List   Diagnosis Date Noted  . Allergic rhinitis 11/08/2013  . First degree hemorrhoids 11/08/2013  . Right inguinal hernia 11/08/2013  . Hyperlipidemia 05/04/2012  . Screening 05/04/2012  . Erectile dysfunction 05/04/2012  . Rheumatic fever   . Bundle branch block, unspecified   . BPH (benign prostatic hyperplasia)    Outpatient Encounter Medications as of 11/19/2017  Medication Sig  . albuterol (PROVENTIL HFA;VENTOLIN HFA) 108 (90 Base) MCG/ACT inhaler Inhale 2 puffs into the lungs every 6 (six) hours as needed for  wheezing or shortness of breath.  Marland Kitchen aspirin 81 MG chewable tablet Chew 81 mg by mouth daily.  . Azelastine HCl 0.15 % SOLN USE 1 SPRAYS INTO EACH NOSTRIL AS NEEDED FOR NASAL CONGESTION  . Cholecalciferol (VITAMIN D) 2000 UNITS CAPS Take 1 capsule by mouth daily.  . fluticasone (FLONASE) 50 MCG/ACT nasal spray USE 2 SPRAYS IN EACH NOSTRIL ONCE DAILY.  Marland Kitchen omega-3 acid ethyl esters (LOVAZA) 1 g capsule TAKE 4 CAPSULES DAILY AS DIRECTED  . Ibuprofen (ADVIL PO) Take by mouth.  . [DISCONTINUED] azithromycin (ZITHROMAX Z-PAK) 250 MG tablet As directed  . [DISCONTINUED] HYDROcodone-homatropine (HYCODAN) 5-1.5 MG/5ML syrup Take 5-10 mLs by mouth every 6 (six) hours as needed. (Patient not taking: Reported on 05/21/2017)  . [DISCONTINUED] Pitavastatin Calcium (LIVALO) 4 MG TABS Take 1 tablet (4 mg total) daily by mouth. As directed   No facility-administered encounter medications on file as of 11/19/2017.       Review of Systems  Constitutional: Negative.   HENT: Negative.   Eyes: Negative.   Respiratory: Negative.   Cardiovascular: Chest pain: at night   Gastrointestinal: Negative.        Indigestion   Endocrine: Negative.   Genitourinary: Negative.   Musculoskeletal: Negative.   Skin: Negative.   Allergic/Immunologic: Negative.   Neurological: Negative.   Hematological: Negative.   Psychiatric/Behavioral: Negative.  Objective:   Physical Exam  Constitutional: He is oriented to person, place, and time. He appears well-developed and well-nourished. No distress.  Should it is calm pleasant and relaxed.  HENT:  Head: Normocephalic and atraumatic.  Right Ear: External ear normal.  Left Ear: External ear normal.  Nose: Nose normal.  Mouth/Throat: Oropharynx is clear and moist. No oropharyngeal exudate.  Eyes: Pupils are equal, round, and reactive to light. Conjunctivae and EOM are normal. Right eye exhibits no discharge. Left eye exhibits no discharge. No scleral icterus.  Neck:  Normal range of motion. Neck supple. No thyromegaly present.  No bruits thyromegaly or anterior cervical adenopathy  Cardiovascular: Normal rate, regular rhythm, normal heart sounds and intact distal pulses.  No murmur heard. Heart has a regular rate and rhythm at 72/min with good pedal pulse  Pulmonary/Chest: Effort normal and breath sounds normal. He has no wheezes. He has no rales. He exhibits no tenderness.  Clear anteriorly and posteriorly and no axillary adenopathy and no chest wall tenderness  Abdominal: Soft. Bowel sounds are normal. He exhibits no mass. There is tenderness. There is no rebound and no guarding.  Slight epigastric tenderness without liver or spleen enlargement.  No inguinal adenopathy no other abdominal tenderness noted no masses or bruits.  Musculoskeletal: Normal range of motion. He exhibits no edema.  Lymphadenopathy:    He has no cervical adenopathy.  Neurological: He is alert and oriented to person, place, and time. He has normal reflexes.  Skin: Skin is warm and dry. No rash noted.  Psychiatric: He has a normal mood and affect. His behavior is normal. Judgment and thought content normal.  The patient's mood affect and behavior were all normal.  Nursing note and vitals reviewed.   BP 110/66 (BP Location: Left Arm)   Pulse 62   Temp (!) 97.1 F (36.2 C) (Oral)   Ht 5\' 10"  (1.778 m)   Wt 182 lb (82.6 kg)   BMI 26.11 kg/m   The EKG result today was within normal limits     Assessment & Plan:  .1. Chest pain, unspecified type -This episode of chest pain happened last week and lasted for 2 days.  It does not sound like reflux as the patient describes no heartburn or indigestion just chest pain that lasted for 2 days radiating to the left arm.  He did have some dizziness.  He continued with his normal activity including exercise which did not worsen the symptoms. - EKG 12-Lead  2. Epigastric abdominal tenderness without rebound tenderness -The patient does  have some epigastric tenderness and we will have him start Pepcid AC 2 tablets twice daily before breakfast and supper and we will schedule him for an endoscopy to follow-up and better manage the etiology of his chest discomfort and at the same time arrange for him to see the cardiologist.  No orders of the defined types were placed in this encounter.  Patient Instructions  The patient should take Pepcid AC to twice daily before breakfast and supper He should avoid NSAIDs alcohol and caffeine as much as possible We will discuss his case with his cardiologist because of the history We will also arrange for him to see the gastroenterologist for an endoscopy We will call with blood work results as soon as those results become available He will also return an FOBT   Nyra Capes MD

## 2017-11-20 LAB — CBC WITH DIFFERENTIAL/PLATELET
BASOS ABS: 0 10*3/uL (ref 0.0–0.2)
Basos: 1 %
EOS (ABSOLUTE): 0.1 10*3/uL (ref 0.0–0.4)
Eos: 2 %
HEMOGLOBIN: 15.7 g/dL (ref 13.0–17.7)
Hematocrit: 45.2 % (ref 37.5–51.0)
Immature Grans (Abs): 0 10*3/uL (ref 0.0–0.1)
Immature Granulocytes: 0 %
LYMPHS ABS: 1.4 10*3/uL (ref 0.7–3.1)
Lymphs: 33 %
MCH: 31.7 pg (ref 26.6–33.0)
MCHC: 34.7 g/dL (ref 31.5–35.7)
MCV: 91 fL (ref 79–97)
Monocytes Absolute: 0.5 10*3/uL (ref 0.1–0.9)
Monocytes: 10 %
Neutrophils Absolute: 2.3 10*3/uL (ref 1.4–7.0)
Neutrophils: 54 %
PLATELETS: 173 10*3/uL (ref 150–450)
RBC: 4.96 x10E6/uL (ref 4.14–5.80)
RDW: 12.7 % (ref 12.3–15.4)
WBC: 4.3 10*3/uL (ref 3.4–10.8)

## 2017-11-20 LAB — BMP8+EGFR
BUN / CREAT RATIO: 14 (ref 9–20)
BUN: 16 mg/dL (ref 6–24)
CO2: 24 mmol/L (ref 20–29)
CREATININE: 1.14 mg/dL (ref 0.76–1.27)
Calcium: 9.4 mg/dL (ref 8.7–10.2)
Chloride: 106 mmol/L (ref 96–106)
GFR, EST AFRICAN AMERICAN: 84 mL/min/{1.73_m2} (ref >59)
GFR, EST NON AFRICAN AMERICAN: 73 mL/min/{1.73_m2} (ref >59)
GLUCOSE: 84 mg/dL (ref 65–99)
Potassium: 4.3 mmol/L (ref 3.5–5.2)
Sodium: 144 mmol/L (ref 134–144)

## 2017-11-20 LAB — LIPID PANEL
CHOL/HDL RATIO: 5 ratio (ref 0.0–5.0)
Cholesterol, Total: 185 mg/dL (ref 100–199)
HDL: 37 mg/dL — AB (ref >39)
LDL Calculated: 134 mg/dL — ABNORMAL HIGH (ref 0–99)
TRIGLYCERIDES: 72 mg/dL (ref 0–149)
VLDL CHOLESTEROL CAL: 14 mg/dL (ref 5–40)

## 2017-11-20 LAB — PSA, TOTAL AND FREE
PSA, Free Pct: 32 %
PSA, Free: 0.32 ng/mL
Prostate Specific Ag, Serum: 1 ng/mL (ref 0.0–4.0)

## 2017-11-20 LAB — HEPATIC FUNCTION PANEL
ALT: 22 IU/L (ref 0–44)
AST: 9 IU/L (ref 0–40)
Albumin: 4.5 g/dL (ref 3.5–5.5)
Alkaline Phosphatase: 52 IU/L (ref 39–117)
BILIRUBIN, DIRECT: 0.14 mg/dL (ref 0.00–0.40)
Bilirubin Total: 0.6 mg/dL (ref 0.0–1.2)
Total Protein: 6.4 g/dL (ref 6.0–8.5)

## 2017-11-20 LAB — VITAMIN D 25 HYDROXY (VIT D DEFICIENCY, FRACTURES): VIT D 25 HYDROXY: 64.9 ng/mL (ref 30.0–100.0)

## 2017-11-23 NOTE — Progress Notes (Signed)
Cardiology Office Note   Date:  11/26/2017   ID:  Kyle Andrade, DOB 05-20-1965, MRN [id number]  PCP:  Ernestina Penna, MD  Cardiologist:   No primary care provider on file. Referring:  Ernestina Penna, MD  Chief Complaint  Patient presents with  . Coronary Artery Disease      History of Present Illness: Kyle Andrade is a 52 y.o. male who had a CT in 2014 and was found to have elevated coronary calcium with non obstructive CAD.  He is referred by Ernestina Penna, MD for evaluation of this.   I reviewed the hospital records from 2014.  He presented with chest discomfort.  They did a coronary CT.  He had a calcium score of 122 which was 93rd percentile for age.  There was a 20% LAD stenosis.   His last follow up was a negative POET (Plain Old Exercise Treadmill) in Jan of this year.    Unfortunately continues to have chest discomfort.  A couple of weeks ago he had some severe discomfort.  This woke him from his sleep.  It is a dull left-sided discomfort under his left breast.  There was no radiation.  At its peak it was 5 out of 10 in intensity.  It did not go away when he drank water or rolled over to a different side.  He felt a little dizzy with it.  He may have had this kind of discomfort in the past but not recently.  He did not have any associated nausea vomiting or diaphoresis.  He did not have any palpitations, presyncope or syncope.  It lasted for about 36 hours and went away spontaneously and he has been able to exercise since that time   Past Medical History:  Diagnosis Date  . Chest discomfort 03/2012   Sent to ED, had CT scan & was sent home. Mild coronary calcification in the LAD was noted on CT scan.  . Coronary atherosclerosis due to calcified coronary lesion(414.4)   . Hyperlipidemia   . Hyperplasia of prostate   . 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 SOB (shortness of breath) 03/2012   Prior to CP as well  . Unspecified hemorrhoids without  mention of complication   . Vitamin D deficiency     Past Surgical History:  Procedure Laterality Date  . URETHRA SURGERY  as a child     Current Outpatient Medications  Medication Sig Dispense Refill  . albuterol (PROVENTIL HFA;VENTOLIN HFA) 108 (90 Base) MCG/ACT inhaler Inhale 2 puffs into the lungs every 6 (six) hours as needed for wheezing or shortness of breath. 1 Inhaler 0  . aspirin 81 MG chewable tablet Chew 81 mg by mouth daily.    . Azelastine HCl 0.15 % SOLN USE 1 SPRAYS INTO EACH NOSTRIL AS NEEDED FOR NASAL CONGESTION 30 mL 3  . Cholecalciferol (VITAMIN D) 2000 UNITS CAPS Take 1 capsule by mouth daily.    . Famotidine (PEPCID AC PO) Take 1 tablet by mouth daily.    . fluticasone (FLONASE) 50 MCG/ACT nasal spray USE 2 SPRAYS IN EACH NOSTRIL ONCE DAILY. 16 g 0  . Ibuprofen (ADVIL PO) Take by mouth.    . omega-3 acid ethyl esters (LOVAZA) 1 g capsule TAKE 4 CAPSULES DAILY AS DIRECTED 120 capsule 3  . rosuvastatin (CRESTOR) 5 MG tablet Take 1 tablet (5 mg total) by mouth daily. Take 1/2 tablet Monday, Wednesday and Friday. 30 tablet 2  No current facility-administered medications for this visit.     Allergies:   Simvastatin and Lipitor [atorvastatin calcium]   ROS:  Please see the history of present illness.   Otherwise, review of systems are positive for none.   All other systems are reviewed and negative.    PHYSICAL EXAM: VS:  BP 123/73   Pulse (!) 59   Ht 5\' 10"  (1.778 m)   Wt 185 lb (83.9 kg)   SpO2 100%   BMI 26.54 kg/m  , BMI Body mass index is 26.54 kg/m. GENERAL:  Well appearing NECK:  No jugular venous distention, waveform within normal limits, carotid upstroke brisk and symmetric, no bruits, no thyromegaly LUNGS:  Clear to auscultation bilaterally CHEST:  Unremarkable HEART:  PMI not displaced or sustained,S1 and S2 within normal limits, no S3, no S4, no clicks, no rubs, no murmurs ABD:  Flat, positive bowel sounds normal in frequency in pitch, no  bruits, no rebound, no guarding, no midline pulsatile mass, no hepatomegaly, no splenomegaly EXT:  2 plus pulses throughout, no edema, no cyanosis no clubbing   EKG:  EKG is not ordered today. The ekg ordered 11/05/17 demonstrates sinus rhythm, rate 97, axis within normal limits, intervals within normal limits, no acute ST-T wave changes.   Recent Labs: 11/19/2017: ALT 22; BUN 16; Creatinine, Ser 1.14; Hemoglobin 15.7; Platelets 173; Potassium 4.3; Sodium 144    Lipid Panel    Component Value Date/Time   CHOL 185 11/19/2017 0836   CHOL 118 05/04/2012 0914   TRIG 72 11/19/2017 0836   TRIG 77 11/26/2016 0935   TRIG 80 05/04/2012 0914   HDL 37 (L) 11/19/2017 0836   HDL 38 (L) 11/26/2016 0935   HDL 37 (L) 05/04/2012 0914   CHOLHDL 5.0 11/19/2017 0836   LDLCALC 134 (H) 11/19/2017 0836   LDLCALC 77 11/08/2013 0852   LDLCALC 65 05/04/2012 0914      Wt Readings from Last 3 Encounters:  11/26/17 185 lb (83.9 kg)  11/19/17 182 lb (82.6 kg)  05/21/17 182 lb (82.6 kg)      Other studies Reviewed: Additional studies/ records that were reviewed today include: Labs Review of the above records demonstrates:     ASSESSMENT AND PLAN:  CAD:    Given his previous nonobstructive coronary disease and his new symptoms he needs further testing.  The pretest probability of obstructive coronary disease as an etiology is at least moderate.  Therefore, I think imaging is indicated with coronary CTA.   DYSLIPIDEMIA:    We discussed dietary changes in quite a significant amount of detail.  Is about to start Crestor and agree with this.  If he does not tolerate this I might suggest a PCSK9 inhibitor.    Current medicines are reviewed at length with the patient today.  The patient does not have concerns regarding medicines.  The following changes have been made:  None  Labs/ tests ordered today include:    Orders Placed This Encounter  Procedures  . CT CORONARY MORPH W/CTA COR W/SCORE W/CA  W/CM &/OR WO/CM  . CT CORONARY FRACTIONAL FLOW RESERVE DATA PREP  . CT CORONARY FRACTIONAL FLOW RESERVE FLUID ANALYSIS     Disposition:   FU with me in one year.    Signed, Rollene Rotunda, MD  11/26/2017 12:48 PM    [facility]

## 2017-11-24 MED ORDER — ROSUVASTATIN CALCIUM 5 MG PO TABS: 5.0000 mg | ORAL_TABLET | Freq: Every day | ORAL | 2 refills | Status: DC

## 2017-11-24 NOTE — Addendum Note (Signed)
Addended by: Lorelee Cover C on: 11/24/2017 01:29 PM   Modules accepted: Orders

## 2017-11-25 ENCOUNTER — Encounter: Payer: Self-pay | Admitting: Cardiology

## 2017-11-26 ENCOUNTER — Encounter: Payer: Self-pay | Admitting: Cardiology

## 2017-11-26 ENCOUNTER — Ambulatory Visit: Payer: BC Managed Care – PPO | Admitting: Cardiology

## 2017-11-26 VITALS — BP 123/73 | HR 59 | Ht 70.0 in | Wt 185.0 lb

## 2017-11-26 DIAGNOSIS — R931 Abnormal findings on diagnostic imaging of heart and coronary circulation: Secondary | ICD-10-CM | POA: Diagnosis not present

## 2017-11-26 DIAGNOSIS — R079 Chest pain, unspecified: Secondary | ICD-10-CM

## 2017-11-26 NOTE — Patient Instructions (Addendum)
Medication Instructions:  Continue current medications  If you need a refill on your cardiac medications before your next appointment, please call your pharmacy.  Labwork: None Ordered   You will NOT need to fast   If you have labs (blood work) drawn today and your tests are completely normal, you will receive your results only by: Marland Kitchen MyChart Message (if you have MyChart) OR . A paper copy in the mail If you have any lab test that is abnormal or we need to change your treatment, we will call you to review the results.  Testing/Procedures: Non-Cardiac CT Angiography (CTA), is a special type of CT scan that uses a computer to produce multi-dimensional views of major blood vessels throughout the body. In CT angiography, a contrast material is injected through an IV to help visualize the blood vessels  Special Instructions: Please arrive at the [facility] main entrance of [facility] at            AM (30-45 minutes prior to test start time)  [facility] [address] [place], [place] [address] [phone]  Proceed to the [facility] Radiology Department (First Floor).  Please follow these instructions carefully (unless otherwise directed):  Hold all erectile dysfunction medications at least 48 hours prior to test.  On the Night Before the Test: . Be sure to Drink plenty of water. . Do not consume any caffeinated/decaffeinated beverages or chocolate 12 hours prior to your test. . Do not take any antihistamines 12 hours prior to your test. . If you take Metformin do not take 24 hours prior to test. . If the patient has contrast allergy: ? Patient will need a prescription for Prednisone and very clear instructions (as follows): 1. Prednisone 50 mg - take 13 hours prior to test 2. Take another Prednisone 50 mg 7 hours prior to test 3. Take another Prednisone 50 mg 1 hour prior to test 4. Take Benadryl 50 mg 1 hour prior to test . Patient must complete all  four doses of above prophylactic medications. . Patient will need a ride after test due to Benadryl.  On the Day of the Test: . Drink plenty of water. Do not drink any water within one hour of the test. . Do not eat any food 4 hours prior to the test. . You may take your regular medications prior to the test.  . Take metoprolol (Lopressor) two hours prior to test. . HOLD Furosemide/Hydrochlorothiazide morning of the test.   After the Test: . Drink plenty of water. . After receiving IV contrast, you may experience a mild flushed feeling. This is normal. . On occasion, you may experience a mild rash up to 24 hours after the test. This is not dangerous. If this occurs, you can take Benadryl 25 mg and increase your fluid intake. . If you experience trouble breathing, this can be serious. If it is severe call 911 IMMEDIATELY. If it is mild, please call our office. . If you take any of these medications: Glipizide/Metformin, Avandament, Glucavance, please do not take 48 hours after completing test.   Follow-Up: You will need a follow up appointment in 1 Year.  Please call our office 2 months in [phone]) to schedule the (1 Year) appointment.  You may see  DR Antoine Poche, or one of the following Advanced Practice Providers on your designated Care Team:    . Joni Reining, DNP, ANP . Rhonda Barrett, PA-C  . Corine Shelter, [place]  . Wynema Birch  Meng, PA-C . Micah Flesher, PA-C  At [facility], you and your health needs are our priority.  As part of our continuing mission to provide you with exceptional heart care, we have created designated Provider Care Teams.  These Care Teams include your primary Cardiologist (physician) and Advanced Practice Providers (APPs -  Physician Assistants and Nurse Practitioners) who all work together to provide you with the care you need, when you need it.   Thank you for choosing [facility] at [facility]!!

## 2017-12-03 ENCOUNTER — Encounter: Payer: Self-pay | Admitting: Family Medicine

## 2017-12-03 ENCOUNTER — Other Ambulatory Visit: Payer: Self-pay | Admitting: Family Medicine

## 2017-12-03 ENCOUNTER — Ambulatory Visit (INDEPENDENT_AMBULATORY_CARE_PROVIDER_SITE_OTHER): Payer: BC Managed Care – PPO | Admitting: Family Medicine

## 2017-12-03 VITALS — BP 98/64 | HR 70 | Temp 97.3°F | Ht 70.0 in | Wt 185.0 lb

## 2017-12-03 DIAGNOSIS — K4090 Unilateral inguinal hernia, without obstruction or gangrene, not specified as recurrent: Secondary | ICD-10-CM

## 2017-12-03 DIAGNOSIS — E7800 Pure hypercholesterolemia, unspecified: Secondary | ICD-10-CM

## 2017-12-03 DIAGNOSIS — R10816 Epigastric abdominal tenderness: Secondary | ICD-10-CM

## 2017-12-03 DIAGNOSIS — Z0000 Encounter for general adult medical examination without abnormal findings: Secondary | ICD-10-CM | POA: Diagnosis not present

## 2017-12-03 DIAGNOSIS — E559 Vitamin D deficiency, unspecified: Secondary | ICD-10-CM

## 2017-12-03 DIAGNOSIS — N400 Enlarged prostate without lower urinary tract symptoms: Secondary | ICD-10-CM

## 2017-12-03 HISTORY — DX: Unilateral inguinal hernia, without obstruction or gangrene, not specified as recurrent: K40.90

## 2017-12-03 LAB — URINALYSIS, COMPLETE
Bilirubin, UA: NEGATIVE
Glucose, UA: NEGATIVE
Ketones, UA: NEGATIVE
LEUKOCYTES UA: NEGATIVE
Nitrite, UA: NEGATIVE
PH UA: 5.5 (ref 5.0–7.5)
PROTEIN UA: NEGATIVE
RBC, UA: NEGATIVE
Specific Gravity, UA: 1.02 (ref 1.005–1.030)
Urobilinogen, Ur: 0.2 mg/dL (ref 0.2–1.0)

## 2017-12-03 LAB — MICROSCOPIC EXAMINATION
Bacteria, UA: NONE SEEN
EPITHELIAL CELLS (NON RENAL): NONE SEEN /HPF (ref 0–10)
RBC, UA: NONE SEEN /hpf (ref 0–2)
RENAL EPITHEL UA: NONE SEEN /HPF
WBC, UA: NONE SEEN /hpf (ref 0–5)

## 2017-12-03 NOTE — Patient Instructions (Addendum)
Continue current medications. Continue good therapeutic lifestyle changes which include good diet and exercise. Fall precautions discussed with patient. If an FOBT was given today- please return it to our front desk. If you are over 54 years old - you may need Prevnar 13 or the adult Pneumonia vaccine.  **Flu shots are available--- please call and schedule a FLU-CLINIC appointment**  After your visit with us today you will receive a survey in the mail or online from [employer] regarding your care with us. Please take a moment to fill this out. Your feedback is very important to us as you can help us better understand your patient needs as well as improve your experience and satisfaction. WE CARE ABOUT YOU!!!   Follow-up with cardiology study as planned Continue to take Pepcid AC before dinner Watch diet more closely and eat more healthy Avoid fried and greasy foods caffeine highly spice foods alcohol and NSAIDs as much as possible If chest discomfort continues, patient is aware that he may need an endoscopy with a gastroenterologist. We will make sure that he has returned a recent FOBT. After starting the Crestor 5 mg he will need a repeat lipid liver panel in about 6 weeks.  This should be fasting.

## 2017-12-03 NOTE — Progress Notes (Signed)
Subjective:    Patient ID: Kyle Andrade, male    DOB: [birth year], 52 y.o.   MRN: [id number]  HPI Patient is here today for annual wellness exam and follow up of chronic medical problems. He is taking medication regularly.  The patient's recent lab work was reviewed with him.  The major thing that was out of line was that his LDL C was elevated.  The good cholesterol was low.  Renal function CBC were all normal.  The PSA was stable from the previous reading.  The patient is feeling a lot better and having no nighttime discomfort as he was previously.  The Pepcid AC before dinner has helped considerably.  Our plan in place is that if he continues to have problems with the Pepcid [facility] then he will need to go get an endoscopy and I explained this to him.  He will watch his diet more closely watching caffeine intake fried foods NSAIDs and staying away from alcohol as much as possible.  He has no other complaints today and the review of systems is otherwise negative.  He understands that he will need a repeat lipid liver panel after being on the Crestor in about 6 weeks.  The majority of the physical exam was done on a previous visit with his complaint and referral to the cardiologist.    Patient Active Problem List   Diagnosis Date Noted  . Elevated coronary artery calcium score 11/26/2017  . Chest pain 11/26/2017  . Allergic rhinitis 11/08/2013  . First degree hemorrhoids 11/08/2013  . Right inguinal hernia 11/08/2013  . Hyperlipidemia 05/04/2012  . Screening 05/04/2012  . Erectile dysfunction 05/04/2012  . Rheumatic fever   . Bundle branch block, unspecified   . BPH (benign prostatic hyperplasia)    Outpatient Encounter Medications as of 12/03/2017  Medication Sig  . albuterol (PROVENTIL HFA;VENTOLIN HFA) 108 (90 Base) MCG/ACT inhaler Inhale 2 puffs into the lungs every 6 (six) hours as needed for wheezing or shortness of breath.  Marland Kitchen aspirin 81 MG chewable tablet Chew 81 mg by mouth daily.    . Azelastine HCl 0.15 % SOLN USE 1 SPRAYS INTO EACH NOSTRIL AS NEEDED FOR NASAL CONGESTION  . Cholecalciferol (VITAMIN D) 2000 UNITS CAPS Take 1 capsule by mouth daily.  . Famotidine (PEPCID AC PO) Take 1 tablet by mouth daily.  . fluticasone (FLONASE) 50 MCG/ACT nasal spray USE 2 SPRAYS IN EACH NOSTRIL ONCE DAILY.  Marland Kitchen Ibuprofen (ADVIL PO) Take by mouth.  . omega-3 acid ethyl esters (LOVAZA) 1 g capsule TAKE 4 CAPSULES DAILY AS DIRECTED  . rosuvastatin (CRESTOR) 5 MG tablet Take 1 tablet (5 mg total) by mouth daily. Take 1/2 tablet Monday, Wednesday and Friday.   No facility-administered encounter medications on file as of 12/03/2017.      Review of Systems  Constitutional: Positive for diaphoresis.  HENT: Negative.   Eyes: Negative.   Respiratory: Negative.   Cardiovascular: Negative.   Gastrointestinal: Negative.   Endocrine: Negative.   Genitourinary: Negative.   Musculoskeletal: Negative.   Skin: Negative.   Allergic/Immunologic: Negative.   Neurological: Negative.   Hematological: Negative.   Psychiatric/Behavioral: Negative.        Objective:   Physical Exam  Constitutional: He is oriented to person, place, and time. He appears well-developed and well-nourished. No distress.  HENT:  Head: Normocephalic and atraumatic.  Right Ear: External ear normal.  Left Ear: External ear normal.  Nose: Nose normal.  Mouth/Throat: Oropharynx is clear and moist.  No oropharyngeal exudate.  Eyes: Pupils are equal, round, and reactive to light. Conjunctivae and EOM are normal. Right eye exhibits no discharge. Left eye exhibits no discharge. No scleral icterus.  Neck: Normal range of motion. Neck supple. No tracheal deviation present. No thyromegaly present.  Cardiovascular: Normal rate, regular rhythm, normal heart sounds and intact distal pulses. Exam reveals no gallop and no friction rub.  No murmur heard. Pulmonary/Chest: Effort normal and breath sounds normal. No respiratory  distress. He has no wheezes. He has no rales. He exhibits no tenderness.  Abdominal: Soft. Bowel sounds are normal. He exhibits no mass. There is no splenomegaly or hepatomegaly. There is no tenderness. There is no rebound and no guarding.  Genitourinary: Rectum normal, prostate normal and penis normal.  Genitourinary Comments: Prostate is slightly enlarged.  There is a left inguinal hernia.  There were no rectal masses.  The external genitalia were normal other than the small left inguinal hernia.  This is been present for a while.  Musculoskeletal: Normal range of motion. He exhibits no edema or tenderness.  Lymphadenopathy:    He has no cervical adenopathy.  Neurological: He is alert and oriented to person, place, and time. He has normal reflexes. No cranial nerve deficit.  Skin: Skin is warm and dry. No rash noted. No erythema. No pallor.  Psychiatric: He has a normal mood and affect. His behavior is normal. Judgment and thought content normal.  Nursing note and vitals reviewed.   BP 98/64 (BP Location: Left Arm)   Pulse 70   Temp (!) 97.3 F (36.3 C) (Oral)   Ht 5\' 10"  (1.778 m)   Wt 185 lb (83.9 kg)   BMI 26.54 kg/m        Assessment & Plan:  1. Annual physical exam -Continue to drink plenty of fluids and stay well-hydrated - Urinalysis, Complete  2. Pure hypercholesterolemia -Continue with Crestor and follow-up with cardiology on a regular basis as determined by the cardiologist. - Hepatic function panel; Future - Lipid panel; Future  3. Left inguinal hernia -The patient should get back in touch with [place] if he develops any pain in the left inguinal area.  4. Vitamin D deficiency -Continue with vitamin D replacement  5.  BPH -Continue to drink plenty of fluids and stay well-hydrated  6.  Epigastric discomfort -Continue with Pepcid AC and have problems with chest and stomach continue patient will need to see gastroenterologist for endoscopy  Patient Instructions    Continue current medications. Continue good therapeutic lifestyle changes which include good diet and exercise. Fall precautions discussed with patient. If an FOBT was given today- please return it to our front desk. If you are over 48 years old - you may need Prevnar 13 or the adult Pneumonia vaccine.  **Flu shots are available--- please call and schedule a FLU-CLINIC appointment**  After your visit with [place] today you will receive a survey in the mail or online from [employer] regarding your care with [place]. Please take a moment to fill this out. Your feedback is very important to [place] as you can help [place] better understand your patient needs as well as improve your experience and satisfaction. WE CARE ABOUT YOU!!!   Follow-up with cardiology study as planned Continue to take Pepcid AC before dinner Watch diet more closely and eat more healthy Avoid fried and greasy foods caffeine highly spice foods alcohol and NSAIDs as much as possible If chest discomfort continues, patient is aware that he may need  an endoscopy with a gastroenterologist. We will make sure that he has returned a recent FOBT. After starting the Crestor 5 mg he will need a repeat lipid liver panel in about 6 weeks.  This should be fasting.  Nyra Capeson W. Moore MD

## 2017-12-25 ENCOUNTER — Encounter: Payer: Self-pay | Admitting: Family Medicine

## 2018-01-08 ENCOUNTER — Telehealth (HOSPITAL_COMMUNITY): Payer: Self-pay | Admitting: Emergency Medicine

## 2018-01-08 NOTE — Telephone Encounter (Signed)
Reaching out to patient to offer assistance regarding upcoming cardiac imaging study; unable to reach patient, automated messaging system answered; will try again tomorrow Rock[place] AlexandriaSara Gaje Tennyson RN Navigator Cardiac Imaging [phone]

## 2018-01-12 ENCOUNTER — Ambulatory Visit (HOSPITAL_COMMUNITY)
Admission: RE | Admit: 2018-01-12 | Discharge: 2018-01-12 | Disposition: A | Discharge status: Home / Self Care | Payer: BC Managed Care – PPO | Source: Ambulatory Visit | Attending: Cardiology | Admitting: Cardiology

## 2018-01-12 ENCOUNTER — Ambulatory Visit (HOSPITAL_COMMUNITY): Admission: RE | Admit: 2018-01-12 | Payer: BC Managed Care – PPO | Source: Ambulatory Visit

## 2018-01-12 DIAGNOSIS — R079 Chest pain, unspecified: Secondary | ICD-10-CM

## 2018-01-12 MED ORDER — NITROGLYCERIN 0.4 MG SL SUBL
SUBLINGUAL_TABLET | SUBLINGUAL | Status: AC
  Filled 2018-01-12: qty 2

## 2018-01-12 MED ORDER — NITROGLYCERIN 0.4 MG SL SUBL
0.8000 mg | SUBLINGUAL_TABLET | Freq: Once | SUBLINGUAL | Status: AC
  Administered 2018-01-12: 0.8 mg via SUBLINGUAL
  Filled 2018-01-12: qty 25

## 2018-01-12 MED ORDER — IOPAMIDOL (ISOVUE-370) INJECTION 76%
80.0000 mL | Freq: Once | INTRAVENOUS | Status: AC | PRN
  Administered 2018-01-12: 80 mL via INTRAVENOUS

## 2018-01-19 ENCOUNTER — Telehealth: Payer: Self-pay | Admitting: Cardiology

## 2018-01-19 NOTE — Telephone Encounter (Signed)
New Message   Patient states Dr. Antoine PocheHochrein sent him a message through My chart to schedule an appt soon as possible due to CT results. Don't see the message in patient's chart and Checked schedule and no appt's showing in the near future.

## 2018-01-27 NOTE — Telephone Encounter (Signed)
appt schedule for 01/10 @ 8:40 am

## 2018-01-29 NOTE — Progress Notes (Signed)
Cardiology Office Note   Date:  01/30/2018   ID:  Kyle Andrade, DOB [birth year], MRN [id number]  PCP:  Kyle Penna, MD  Cardiologist:   No primary care provider on file. Referring:  Kyle Penna, MD  Chief Complaint  Patient presents with  . Coronary Artery Disease      History of Present Illness: Kyle Andrade is a 54 y.o. male who had a CT in 2014 and was found to have elevated coronary calcium with non obstructive CAD.  He is referred by Kyle Penna, MD for evaluation of this.   I reviewed the hospital records from 2014.  He presented with chest discomfort.  They did a coronary CT.  He had a calcium score of 122 which was 93rd percentile for age.  There was a 20% LAD stenosis.   His last follow up was a negative POET (Plain Old Exercise Treadmill) in Jan of last year.  He did have some chest discomfort late last year.  I sent him for a CTA.  He was found to have non obstructive disease.  He was found to have 25 to 50% mild long calcified plaque in the proximal LAD and a focal stenosis of 50 to 69% which was slightly progressed.   Past Medical History:  Diagnosis Date  . Chest discomfort 03/2012   Sent to ED, had CT scan & was sent home. Mild coronary calcification in the LAD was noted on CT scan.  . Coronary atherosclerosis due to calcified coronary lesion(414.4)   . Hyperlipidemia   . Hyperplasia of prostate   . 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 SOB (shortness of breath) 03/2012   Prior to CP as well  . Unspecified hemorrhoids without mention of complication   . Vitamin D deficiency     Past Surgical History:  Procedure Laterality Date  . URETHRA SURGERY  as a child     Current Outpatient Medications  Medication Sig Dispense Refill  . albuterol (PROVENTIL HFA;VENTOLIN HFA) 108 (90 Base) MCG/ACT inhaler Inhale 2 puffs into the lungs every 6 (six) hours as needed for wheezing or shortness of breath. 1 Inhaler 0  . aspirin 81 MG  chewable tablet Chew 81 mg by mouth daily.    . Azelastine HCl 0.15 % SOLN USE 1 SPRAYS INTO EACH NOSTRIL AS NEEDED FOR NASAL CONGESTION 30 mL 3  . Cholecalciferol (VITAMIN D) 2000 UNITS CAPS Take 1 capsule by mouth daily.    . Famotidine (PEPCID AC PO) Take 1 tablet by mouth daily.    . fluticasone (FLONASE) 50 MCG/ACT nasal spray USE 2 SPRAYS IN EACH NOSTRIL ONCE DAILY. 16 g 0  . Ibuprofen (ADVIL PO) Take by mouth.    . omega-3 acid ethyl esters (LOVAZA) 1 g capsule TAKE 4 CAPSULES DAILY AS DIRECTED 120 capsule 5  . rosuvastatin (CRESTOR) 20 MG tablet Take 1 tablet (20 mg total) by mouth daily. Take 1/2 tablet Monday, Wednesday and Friday. 90 tablet 3  . sildenafil (VIAGRA) 50 MG tablet Take 1 tablet (50 mg total) by mouth daily as needed for erectile dysfunction. 10 tablet 2   No current facility-administered medications for this visit.     Allergies:   Simvastatin and Lipitor [atorvastatin calcium]   ROS:  Please see the history of present illness.   Otherwise, review of systems are positive for ED.   All other systems are reviewed and negative.    PHYSICAL EXAM: VS:  BP 114/76   Pulse 79   Ht 5\' 10"  (1.778 m)   Wt 183 lb 9.6 oz (83.3 kg)   SpO2 98%   BMI 26.34 kg/m  , BMI Body mass index is 26.34 kg/m. GENERAL:  Well appearing NECK:  No jugular venous distention, waveform within normal limits, carotid upstroke brisk and symmetric, positive bruits bruits, no thyromegaly LUNGS:  Clear to auscultation bilaterally CHEST:  Unremarkable HEART:  PMI not displaced or sustained,S1 and S2 within normal limits, no S3, no S4, no clicks, no rubs, no murmurs ABD:  Flat, positive bowel sounds normal in frequency in pitch, no bruits, no rebound, no guarding, no midline pulsatile mass, no hepatomegaly, no splenomegaly EXT:  2 plus pulses throughout, no edema, no cyanosis no clubbing   EKG:  EKG is not ordered today.    Recent Labs: 11/19/2017: ALT 22; BUN 16; Creatinine, Ser 1.14;  Hemoglobin 15.7; Platelets 173; Potassium 4.3; Sodium 144    Lipid Panel    Component Value Date/Time   CHOL 185 11/19/2017 0836   CHOL 118 05/04/2012 0914   TRIG 72 11/19/2017 0836   TRIG 77 11/26/2016 0935   TRIG 80 05/04/2012 0914   HDL 37 (L) 11/19/2017 0836   HDL 38 (L) 11/26/2016 0935   HDL 37 (L) 05/04/2012 0914   CHOLHDL 5.0 11/19/2017 0836   LDLCALC 134 (H) 11/19/2017 0836   LDLCALC 77 11/08/2013 0852   LDLCALC 65 05/04/2012 0914      Wt Readings from Last 3 Encounters:  01/30/18 183 lb 9.6 oz (83.3 kg)  12/03/17 185 lb (83.9 kg)  11/26/17 185 lb (83.9 kg)      Other studies Reviewed: Additional studies/ records that were reviewed today include: CTA Review of the above records demonstrates:     ASSESSMENT AND PLAN:  CAD:   The patient has no new sypmtoms.  No further cardiovascular testing is indicated.  We will continue with aggressive risk reduction and meds as listed.  We reviewed at length his plan for continued risk reduction.  I will likely bring him back for routine POET (Plain Old Exercise Treadmill)  DYSLIPIDEMIA:    I would like his LDL to be less than 70.  He needs to be at least on a moderate dose of statin.  He is been slightly intolerant of this in the past but he agrees.  He will increase his Crestor 20 mg daily.  Check a lipid and liver in about 8 weeks.  If he does not tolerate this I might suggest a PCSK9 inhibitor.   BRUIT: I will check carotid Doppler.  ED:  I will prescribe Viagra   Current medicines are reviewed at length with the patient today.  The patient does not have concerns regarding medicines.  The following changes have been made:   See above  Labs/ tests ordered today include:    Orders Placed This Encounter  Procedures  . Lipid panel  . Hepatic function panel     Disposition:   FU with me in 6 months.  Signed, Rollene Rotunda, MD  01/30/2018 9:51 AM    [facility]

## 2018-01-30 ENCOUNTER — Ambulatory Visit: Payer: BC Managed Care – PPO | Admitting: Cardiology

## 2018-01-30 ENCOUNTER — Encounter: Payer: Self-pay | Admitting: Cardiology

## 2018-01-30 VITALS — BP 114/76 | HR 79 | Ht 70.0 in | Wt 183.6 lb

## 2018-01-30 DIAGNOSIS — E7800 Pure hypercholesterolemia, unspecified: Secondary | ICD-10-CM | POA: Diagnosis not present

## 2018-01-30 DIAGNOSIS — R0989 Other specified symptoms and signs involving the circulatory and respiratory systems: Secondary | ICD-10-CM | POA: Diagnosis not present

## 2018-01-30 DIAGNOSIS — I2510 Atherosclerotic heart disease of native coronary artery without angina pectoris: Secondary | ICD-10-CM | POA: Diagnosis not present

## 2018-01-30 DIAGNOSIS — Z79899 Other long term (current) drug therapy: Secondary | ICD-10-CM

## 2018-01-30 MED ORDER — ROSUVASTATIN CALCIUM 20 MG PO TABS: 20.0000 mg | ORAL_TABLET | Freq: Every day | ORAL | 3 refills | Status: DC

## 2018-01-30 MED ORDER — SILDENAFIL CITRATE 50 MG PO TABS: 50.0000 mg | ORAL_TABLET | Freq: Every day | ORAL | 2 refills | Status: DC | PRN

## 2018-01-30 NOTE — Patient Instructions (Signed)
Medication Instructions:   INCREASE- Crestor 20 mg daily  If you need a refill on your cardiac medications before your next appointment, please call your pharmacy.  Labwork: Lipid and Liver in 8 weeks HERE IN OUR OFFICE AT [facility]  You will need to fast. DO NOT EAT OR DRINK PAST MIDNIGHT.       Take the provided lab slips with you to the lab for your blood draw.  When you have your labs (blood work) drawn today and your tests are completely normal, you will receive your results only by MyChart Message (if you have MyChart) -OR-  A paper copy in the mail.  If you have any lab test that is abnormal or we need to change your treatment, we will call you to review these results.  Testing/Procedures: Your physician has requested that you have a carotid duplex. This test is an ultrasound of the carotid arteries in your neck. It looks at blood flow through these arteries that supply the brain with blood. Allow one hour for this exam. There are no restrictions or special instructions.  Follow-Up: You will need a follow up appointment in 6 months.  Please call our office 2 months in advance to schedule this appointment.  You may see Dr Antoine PocheHochrein or one of the following Advanced Practice Providers on your designated Care Team:   Theodore DemarkRhonda Barrett, PA-C . Joni ReiningKathryn Lawrence, DNP, ANP   At [facility], you and your health needs are our priority.  As part of our continuing mission to provide you with exceptional heart care, we have created designated Provider Care Teams.  These Care Teams include your primary Cardiologist (physician) and Advanced Practice Providers (APPs -  Physician Assistants and Nurse Practitioners) who all work together to provide you with the care you need, when you need it.  Thank you for choosing [facility] at [facility]!!

## 2018-02-06 ENCOUNTER — Ambulatory Visit (HOSPITAL_COMMUNITY)
Admission: RE | Admit: 2018-02-06 | Discharge: 2018-02-06 | Disposition: A | Discharge status: Home / Self Care | Payer: BC Managed Care – PPO | Source: Ambulatory Visit | Attending: Internal Medicine | Admitting: Internal Medicine

## 2018-02-06 DIAGNOSIS — R0989 Other specified symptoms and signs involving the circulatory and respiratory systems: Secondary | ICD-10-CM

## 2018-06-04 ENCOUNTER — Ambulatory Visit (INDEPENDENT_AMBULATORY_CARE_PROVIDER_SITE_OTHER): Payer: BC Managed Care – PPO | Admitting: Family Medicine

## 2018-06-04 ENCOUNTER — Other Ambulatory Visit: Payer: Self-pay

## 2018-06-04 ENCOUNTER — Encounter: Payer: Self-pay | Admitting: Family Medicine

## 2018-06-04 DIAGNOSIS — R079 Chest pain, unspecified: Secondary | ICD-10-CM

## 2018-06-04 DIAGNOSIS — I2510 Atherosclerotic heart disease of native coronary artery without angina pectoris: Secondary | ICD-10-CM

## 2018-06-04 DIAGNOSIS — N5201 Erectile dysfunction due to arterial insufficiency: Secondary | ICD-10-CM

## 2018-06-04 DIAGNOSIS — Z0000 Encounter for general adult medical examination without abnormal findings: Secondary | ICD-10-CM

## 2018-06-04 DIAGNOSIS — E559 Vitamin D deficiency, unspecified: Secondary | ICD-10-CM

## 2018-06-04 DIAGNOSIS — Z8679 Personal history of other diseases of the circulatory system: Secondary | ICD-10-CM

## 2018-06-04 DIAGNOSIS — E782 Mixed hyperlipidemia: Secondary | ICD-10-CM | POA: Diagnosis not present

## 2018-06-04 DIAGNOSIS — J301 Allergic rhinitis due to pollen: Secondary | ICD-10-CM

## 2018-06-04 MED ORDER — FLUTICASONE PROPIONATE 50 MCG/ACT NA SUSP: 2.0000 | Freq: Every day | NASAL | 6 refills | Status: DC

## 2018-06-04 MED ORDER — ICOSAPENT ETHYL 1 G PO CAPS: 2.0000 | ORAL_CAPSULE | Freq: Two times a day (BID) | ORAL | 3 refills | Status: DC

## 2018-06-04 NOTE — Addendum Note (Signed)
Addended by: Magdalene River on: 06/04/2018 11:17 AM   Modules accepted: Orders

## 2018-06-04 NOTE — Progress Notes (Signed)
Virtual Visit Via telephone Note I connected with@ on 06/04/18 by telephone and verified that I am speaking with the correct person or authorized healthcare agent using two identifiers. Kyle SilvasRandy A Andrade is currently located at home and there are no unauthorized people in close proximity. I completed this visit while in a private location in my home .  This visit type was conducted due to national recommendations for restrictions regarding the COVID-19 Pandemic (e.g. social distancing).  This format is felt to be most appropriate for this patient at this time.  All issues noted in this document were discussed and addressed.  No physical exam was performed.    I discussed the limitations, risks, security and privacy concerns of performing an evaluation and management service by telephone and the availability of in person appointments. I also discussed with the patient that there may be a patient responsible charge related to this service. The patient expressed understanding and agreed to proceed.   Date:  06/04/2018    ID:  Kyle Andrade      [birth year]        [id number]   Patient Care Team Patient Care Team: Ernestina PennaMoore, Reganne Messerschmidt W, MD as PCP - General (Family Medicine)  Reason for Visit: Primary Care Follow-up     History of Present Illness & Review of Systems:     Kyle SilvasRandy A Doris is a 53 y.o. year old male primary care patient that presents today for a telehealth visit.  The patient is doing well.  The issues over the past several months are that he has been started on additional Crestor at 20 mg every other day and then 10 mg on the days he is not taking the 20.  This is because he was found to have native coronary artery disease.  He was followed by the cardiologist.  He was supposed to have had lab work done before now.  We will go ahead and get his lab work and he does have a follow-up visit with the cardiologist in a couple of months.  He does still have some occasional indigestion and  is only taking Pepcid AC if needed I encouraged him to take this more regularly since the indigestion is at night to take it before dinner and to take his chewable aspirin after dinner.  Other than the indigestion he denies any chest pain.  He denies any shortness of breath problems with his stomach other than the indigestion and no trouble with passing his water.  He does have the upcoming visit with a cardiologist in a couple months.  He does not check blood pressures at home.  His weight is less than 180 he says.  The cardiologist wanted aggressive risk factor reduction with getting the LDL-C less than 70.  He did have carotid Dopplers and these were negative.  The patient does have allergic rhinitis and has stopped using Astelin nose spray and finds that Flonase is working better for him.  He needs a prescription for this called in.  Currently taking Lovaza 2 g daily.  We will check and see if his insurance will pay for Vascepa as this has proven it results in reducing heart attack and stroke.  Review of systems as stated, otherwise negative.  The patient does not have symptoms concerning for COVID-19 infection (fever, chills, cough, or new shortness of breath).      Current Medications (Verified) Allergies as of 06/04/2018      Reactions   Simvastatin  Joint pain   Lipitor [atorvastatin Calcium] Other (See Comments)   myalgias      Medication List       Accurate as of Jun 04, 2018  7:51 AM. If you have any questions, ask your nurse or doctor.        ADVIL PO Take by mouth.   albuterol 108 (90 Base) MCG/ACT inhaler Commonly known as:  VENTOLIN HFA Inhale 2 puffs into the lungs every 6 (six) hours as needed for wheezing or shortness of breath.   aspirin 81 MG chewable tablet Chew 81 mg by mouth daily.   Azelastine HCl 0.15 % Soln USE 1 SPRAYS INTO EACH NOSTRIL AS NEEDED FOR NASAL CONGESTION   fluticasone 50 MCG/ACT nasal spray Commonly known as:  FLONASE USE 2 SPRAYS IN EACH  NOSTRIL ONCE DAILY.   omega-3 acid ethyl esters 1 g capsule Commonly known as:  LOVAZA TAKE 4 CAPSULES DAILY AS DIRECTED   PEPCID AC PO Take 1 tablet by mouth daily.   rosuvastatin 20 MG tablet Commonly known as:  Crestor Take 1 tablet (20 mg total) by mouth daily. Take 1/2 tablet Monday, Wednesday and Friday.   sildenafil 50 MG tablet Commonly known as:  Viagra Take 1 tablet (50 mg total) by mouth daily as needed for erectile dysfunction.   Vitamin D 50 MCG (2000 UT) Caps Take 1 capsule by mouth daily.           Allergies (Verified)    Simvastatin and Lipitor [atorvastatin calcium]  Past Medical History Past Medical History:  Diagnosis Date  . Chest discomfort 03/2012   Sent to ED, had CT scan & was sent home. Mild coronary calcification in the LAD was noted on CT scan.  . Coronary atherosclerosis due to calcified coronary lesion(414.4)   . Hyperlipidemia   . Hyperplasia of prostate   . 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 SOB (shortness of breath) 03/2012   Prior to CP as well  . Unspecified hemorrhoids without mention of complication   . Vitamin D deficiency      Past Surgical History:  Procedure Laterality Date  . URETHRA SURGERY  as a child    Social History   Socioeconomic History  . Marital status: Married    Spouse name: Kyle Andrade  . Number of children: Not on file  . Years of education: Not on file  . Highest education level: Not on file  Occupational History  . Occupation: Full time    Employer: [employer]  Social Needs  . Financial resource strain: Not on file  . Food insecurity:    Worry: Not on file    Inability: Not on file  . Transportation needs:    Medical: Not on file    Non-medical: Not on file  Tobacco Use  . Smoking status: Never Smoker  . Smokeless tobacco: Never Used  Substance and Sexual Activity  . Alcohol use: Yes    Alcohol/week: 2.0 standard drinks    Types: 2 Cans of beer per week  . Drug use:  No  . Sexual activity: Not on file  Lifestyle  . Physical activity:    Days per week: Not on file    Minutes per session: Not on file  . Stress: Not on file  Relationships  . Social connections:    Talks on phone: Not on file    Gets together: Not on file    Attends religious service: Not on file  Active member of club or organization: Not on file    Attends meetings of clubs or organizations: Not on file    Relationship status: Not on file  Other Topics Concern  . Not on file  Social History Narrative  . Not on file     Family History  Problem Relation Age of Onset  . Healthy Mother   . Diabetes Father   . Emphysema Father   . CVA Maternal Grandfather   . CVA Paternal Grandfather   . Colon cancer Neg Hx       Labs/Other Tests and Data Reviewed:    Wt Readings from Last 3 Encounters:  01/30/18 183 lb 9.6 oz (83.3 kg)  12/03/17 185 lb (83.9 kg)  11/26/17 185 lb (83.9 kg)   Temp Readings from Last 3 Encounters:  12/03/17 (!) 97.3 F (36.3 C) (Oral)  11/19/17 (!) 97.1 F (36.2 C) (Oral)  05/21/17 (!) 97.1 F (36.2 C) (Oral)   BP Readings from Last 3 Encounters:  01/30/18 114/76  01/12/18 (!) 115/59  12/03/17 98/64   Pulse Readings from Last 3 Encounters:  01/30/18 79  12/03/17 70  11/26/17 (!) 59     Lab Results  Component Value Date   HGBA1C 5.3 11/26/2016   Lab Results  Component Value Date   LDLCALC 134 (H) 11/19/2017   CREATININE 1.14 11/19/2017       Chemistry      Component Value Date/Time   NA 144 11/19/2017 0836   K 4.3 11/19/2017 0836   CL 106 11/19/2017 0836   CO2 24 11/19/2017 0836   BUN 16 11/19/2017 0836   CREATININE 1.14 11/19/2017 0836   CREATININE 1.14 05/04/2012 0914      Component Value Date/Time   CALCIUM 9.4 11/19/2017 0836   ALKPHOS 52 11/19/2017 0836   AST 9 11/19/2017 0836   ALT 22 11/19/2017 0836   BILITOT 0.6 11/19/2017 0836         OBSERVATIONS/ OBJECTIVE:     The patient is positive and feeling  well and exercising 4 days a week and having minimal chest discomfort.  He does not check vital signs at home although he says his weight is less than 180.  He does have an upcoming appointment with a cardiologist in the next couple of months.  He was supposed to have had lab work done for taking the additional Crestor but has not been able to do that and we will get this as soon as possible.  He is currently taking Lovaza and we will see if we can switch this to Vascepa.  He does not need any other refills.  Significant history is that his mother has had a stroke.  Physical exam deferred due to nature of telephonic visit.  ASSESSMENT & PLAN    Time:   Today, I have spent 27 minutes with the patient via telephone discussing the above including Covid precautions.     Visit Diagnoses: 1.  Mixed hyperlipidemia -Lipid panel and liver function test  2. Vitamin D deficiency -Continue vitamin D replacement pending results of lab work  3. Chest pain, unspecified type -The incidence of chest pain has diminished and patient indicates that Pepcid AC especially at nighttime seems to help a lot.  I did encourage him to take this more regularly instead of just as needed.  4. History of rheumatic fever  5. Coronary artery disease involving native coronary artery of native heart without angina pectoris -Follow-up with cardiology as planned and  continue to pursue aggressive therapeutic lifestyle changes with a goal LDL-C of less than 70.  6. Seasonal allergic rhinitis due to pollen -Patient has stopped the Astelin and is now currently using Flonase nasal spray.  He needs a prescription for this called in so that his insurance will cover this.  7. Erectile dysfunction due to arterial insufficiency -No complaints with this during this visit.  Patient Instructions  Repeat lab work as planned Follow-up with Dr. Antoine Poche as planned Continue to practice good respiratory and hand hygiene Continue statin  therapy and aggressive therapeutic lifestyle changes Drink plenty of water and fluids and stay well-hydrated We will try to get patient started on Vascepa at 2 g twice daily because of studies supporting this reduces heart attack and stroke. He will continue with the Crestor and follow-up with cardiology as planned He will come by the office and get lab work. We will call in a prescription for Flonase at 1 to 2 sprays each nostril daily     The above assessment and management plan was discussed with the patient. The patient verbalized understanding of and has agreed to the management plan. Patient is aware to call the clinic if symptoms persist or worsen. Patient is aware when to return to the clinic for a follow-up visit. Patient educated on when it is appropriate to go to the emergency department.    Ernestina Penna, MD [facility] [address] [place], [place], [place] [address] Ph [phone]   Nyra Capes MD

## 2018-06-04 NOTE — Patient Instructions (Addendum)
Repeat lab work as planned Follow-up with Dr. Antoine Poche as planned Continue to practice good respiratory and hand hygiene Continue statin therapy and aggressive therapeutic lifestyle changes Drink plenty of water and fluids and stay well-hydrated We will try to get patient started on Vascepa at 2 g twice daily because of studies supporting this reduces heart attack and stroke. He will continue with the Crestor and follow-up with cardiology as planned He will come by the office and get lab work. We will call in a prescription for Flonase at 1 to 2 sprays each nostril daily

## 2018-06-11 ENCOUNTER — Other Ambulatory Visit: Payer: Self-pay

## 2018-06-11 ENCOUNTER — Other Ambulatory Visit: Payer: BC Managed Care – PPO

## 2018-06-11 DIAGNOSIS — E782 Mixed hyperlipidemia: Secondary | ICD-10-CM

## 2018-06-11 DIAGNOSIS — Z8679 Personal history of other diseases of the circulatory system: Secondary | ICD-10-CM

## 2018-06-11 DIAGNOSIS — Z0000 Encounter for general adult medical examination without abnormal findings: Secondary | ICD-10-CM

## 2018-06-11 DIAGNOSIS — N5201 Erectile dysfunction due to arterial insufficiency: Secondary | ICD-10-CM

## 2018-06-11 DIAGNOSIS — I2510 Atherosclerotic heart disease of native coronary artery without angina pectoris: Secondary | ICD-10-CM

## 2018-06-11 DIAGNOSIS — R079 Chest pain, unspecified: Secondary | ICD-10-CM

## 2018-06-11 DIAGNOSIS — E559 Vitamin D deficiency, unspecified: Secondary | ICD-10-CM

## 2018-06-11 DIAGNOSIS — J301 Allergic rhinitis due to pollen: Secondary | ICD-10-CM

## 2018-06-12 LAB — BMP8+EGFR
BUN/Creatinine Ratio: 13 (ref 9–20)
BUN: 14 mg/dL (ref 6–24)
CO2: 24 mmol/L (ref 20–29)
Calcium: 9.2 mg/dL (ref 8.7–10.2)
Chloride: 105 mmol/L (ref 96–106)
Creatinine, Ser: 1.12 mg/dL (ref 0.76–1.27)
GFR calc Af Amer: 86 mL/min/{1.73_m2} (ref >59)
GFR calc non Af Amer: 74 mL/min/{1.73_m2} (ref >59)
Glucose: 90 mg/dL (ref 65–99)
Potassium: 3.9 mmol/L (ref 3.5–5.2)
Sodium: 143 mmol/L (ref 134–144)

## 2018-06-12 LAB — CBC WITH DIFFERENTIAL/PLATELET
Basophils Absolute: 0 10*3/uL (ref 0.0–0.2)
Basos: 1 %
EOS (ABSOLUTE): 0 10*3/uL (ref 0.0–0.4)
Eos: 1 %
Hematocrit: 42.6 % (ref 37.5–51.0)
Hemoglobin: 14.6 g/dL (ref 13.0–17.7)
Immature Grans (Abs): 0 10*3/uL (ref 0.0–0.1)
Immature Granulocytes: 0 %
Lymphocytes Absolute: 1.3 10*3/uL (ref 0.7–3.1)
Lymphs: 29 %
MCH: 31.1 pg (ref 26.6–33.0)
MCHC: 34.3 g/dL (ref 31.5–35.7)
MCV: 91 fL (ref 79–97)
Monocytes Absolute: 0.3 10*3/uL (ref 0.1–0.9)
Monocytes: 8 %
Neutrophils Absolute: 2.6 10*3/uL (ref 1.4–7.0)
Neutrophils: 61 %
Platelets: 154 10*3/uL (ref 150–450)
RBC: 4.7 x10E6/uL (ref 4.14–5.80)
RDW: 12.5 % (ref 11.6–15.4)
WBC: 4.3 10*3/uL (ref 3.4–10.8)

## 2018-06-12 LAB — THYROID PANEL WITH TSH
Free Thyroxine Index: 1.5 (ref 1.2–4.9)
T3 Uptake Ratio: 23 % — ABNORMAL LOW (ref 24–39)
T4, Total: 6.7 ug/dL (ref 4.5–12.0)
TSH: 3.08 u[IU]/mL (ref 0.450–4.500)

## 2018-06-12 LAB — HEPATIC FUNCTION PANEL
ALT: 25 IU/L (ref 0–44)
AST: 10 IU/L (ref 0–40)
Albumin: 4.5 g/dL (ref 3.8–4.9)
Alkaline Phosphatase: 57 IU/L (ref 39–117)
Bilirubin Total: 0.6 mg/dL (ref 0.0–1.2)
Bilirubin, Direct: 0.19 mg/dL (ref 0.00–0.40)
Total Protein: 6.2 g/dL (ref 6.0–8.5)

## 2018-06-12 LAB — LIPID PANEL
Chol/HDL Ratio: 3.1 ratio (ref 0.0–5.0)
Cholesterol, Total: 107 mg/dL (ref 100–199)
HDL: 35 mg/dL — ABNORMAL LOW (ref >39)
LDL Calculated: 60 mg/dL (ref 0–99)
Triglycerides: 59 mg/dL (ref 0–149)
VLDL Cholesterol Cal: 12 mg/dL (ref 5–40)

## 2018-06-12 LAB — VITAMIN D 25 HYDROXY (VIT D DEFICIENCY, FRACTURES): Vit D, 25-Hydroxy: 69.2 ng/mL (ref 30.0–100.0)

## 2018-10-04 DIAGNOSIS — E785 Hyperlipidemia, unspecified: Secondary | ICD-10-CM | POA: Insufficient documentation

## 2018-10-04 NOTE — Progress Notes (Signed)
Cardiology Office Note   Date:  10/06/2018   ID:  Kyle Andrade, DOB Jan 20, 1966, MRN [id number]  PCP:  Chipper Herb, MD (Inactive)  Cardiologist:   No primary care provider on file.   Chief Complaint  Patient presents with  . Coronary Artery Disease    History of Present Illness: Kyle Andrade is a 53 y.o. male who had a CT in 2014 and was found to have elevated coronary calcium with non obstructive CAD.  He is referred by Chipper Herb, MD (Inactive) for evaluation of this.   I reviewed the hospital records from 2014.  He presented with chest discomfort.  They did a coronary CT.  He had a calcium score of 122 which was 93rd percentile for age.  There was a 20% LAD stenosis.   His last follow up was a negative POET (Plain Old Exercise Treadmill) in Jan 2019.  He did have some chest discomfort late last year.  I sent him for a CTA in Dec 2019.  He was found to have non obstructive disease.  He was found to have 25 to 50% mild long calcified plaque in the proximal LAD and a focal stenosis of 50 to 69% which was slightly progressed.  He returns for follow up.  I did put him on OTC antacids and his symptoms completely went away.  Th patient is walking and exercising frequently through the week.  The patient denies any new symptoms such as chest discomfort, neck or arm discomfort. There has been no new shortness of breath, PND or orthopnea. There have been no reported palpitations, presyncope or syncope.    Past Medical History:  Diagnosis Date  . Chest discomfort 03/2012   Sent to ED, had CT scan & was sent home. Mild coronary calcification in the LAD was noted on CT scan.  . Coronary atherosclerosis due to calcified coronary lesion(414.4)   . Hyperlipidemia   . Hyperplasia of prostate   . 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 SOB (shortness of breath) 03/2012   Prior to CP as well  . Unspecified hemorrhoids without mention of complication   . Vitamin D  deficiency     Past Surgical History:  Procedure Laterality Date  . URETHRA SURGERY  as a child     Current Outpatient Medications  Medication Sig Dispense Refill  . albuterol (PROVENTIL HFA;VENTOLIN HFA) 108 (90 Base) MCG/ACT inhaler Inhale 2 puffs into the lungs every 6 (six) hours as needed for wheezing or shortness of breath. 1 Inhaler 0  . aspirin 81 MG chewable tablet Chew 81 mg by mouth daily.    . Azelastine HCl 0.15 % SOLN USE 1 SPRAYS INTO EACH NOSTRIL AS NEEDED FOR NASAL CONGESTION 30 mL 3  . Cholecalciferol (VITAMIN D) 2000 UNITS CAPS Take 1 capsule by mouth daily.    . Famotidine (PEPCID AC PO) Take 1 tablet by mouth daily.    . fluticasone (FLONASE) 50 MCG/ACT nasal spray USE 2 SPRAYS IN EACH NOSTRIL ONCE DAILY. 16 g 0  . fluticasone (FLONASE) 50 MCG/ACT nasal spray Place 2 sprays into both nostrils daily. 16 g 6  . Ibuprofen (ADVIL PO) Take by mouth.    Vanessa Kick Ethyl (VASCEPA) 1 g CAPS Take 2 capsules (2 g total) by mouth 2 (two) times daily. 120 capsule 3  . rosuvastatin (CRESTOR) 20 MG tablet Take 1 tablet (20 mg total) by mouth daily. Take 1/2 tablet Monday, Wednesday and Friday.  90 tablet 3  . sildenafil (VIAGRA) 50 MG tablet Take 1 tablet (50 mg total) by mouth daily as needed for erectile dysfunction. 10 tablet 2  . omega-3 acid ethyl esters (LOVAZA) 1 g capsule TAKE 4 CAPSULES DAILY AS DIRECTED (Patient not taking: Reported on 10/06/2018) 120 capsule 5   No current facility-administered medications for this visit.     Allergies:   Simvastatin and Lipitor [atorvastatin calcium]   ROS:  Please see the history of present illness.   Otherwise, review of systems are positive for none.   All other systems are reviewed and negative.    PHYSICAL EXAM: VS:  BP 120/72   Pulse 77   Ht 5\' 10"  (1.778 m)   Wt 186 lb 3.2 oz (84.5 kg)   BMI 26.72 kg/m  , BMI Body mass index is 26.72 kg/m. GENERAL:  Well appearing NECK:  No jugular venous distention, waveform within  normal limits, carotid upstroke brisk and symmetric, no bruits, no thyromegaly LUNGS:  Clear to auscultation bilaterally CHEST:  Unremarkable HEART:  PMI not displaced or sustained,S1 and S2 within normal limits, no S3, no S4, no clicks, no rubs, no murmurs ABD:  Flat, positive bowel sounds normal in frequency in pitch, no bruits, no rebound, no guarding, no midline pulsatile mass, no hepatomegaly, no splenomegaly EXT:  2 plus pulses throughout, no edema, no cyanosis no clubbing  EKG:  EKG is not ordered today. NA   Recent Labs: 06/11/2018: ALT 25; BUN 14; Creatinine, Ser 1.12; Hemoglobin 14.6; Platelets 154; Potassium 3.9; Sodium 143; TSH 3.080    Lipid Panel    Component Value Date/Time   CHOL 107 06/11/2018 0816   CHOL 118 05/04/2012 0914   TRIG 59 06/11/2018 0816   TRIG 77 11/26/2016 0935   TRIG 80 05/04/2012 0914   HDL 35 (L) 06/11/2018 0816   HDL 38 (L) 11/26/2016 0935   HDL 37 (L) 05/04/2012 0914   CHOLHDL 3.1 06/11/2018 0816   LDLCALC 60 06/11/2018 0816   LDLCALC 77 11/08/2013 0852   LDLCALC 65 05/04/2012 0914      Wt Readings from Last 3 Encounters:  10/06/18 186 lb 3.2 oz (84.5 kg)  01/30/18 183 lb 9.6 oz (83.3 kg)  12/03/17 185 lb (83.9 kg)      Other studies Reviewed: Additional studies/ records that were reviewed today include: Labs Review of the above records demonstrates:   See below.     ASSESSMENT AND PLAN:  CAD:   The patient has no new sypmtoms.  No further cardiovascular testing is indicated.  We will continue with aggressive risk reduction and meds as listed.  DYSLIPIDEMIA:    LDL was 60.Marland Kitchen.  Continue current therapy.    Current medicines are reviewed at length with the patient today.  The patient does not have concerns regarding medicines.  The following changes have been made:   None  Labs/ tests ordered today include:  None  No orders of the defined types were placed in this encounter.    Disposition:   FU with me in 12 months.   Signed, Rollene RotundaJames Dezaria Methot, MD  10/06/2018 2:08 PM    [facility]

## 2018-10-06 ENCOUNTER — Encounter: Payer: Self-pay | Admitting: Cardiology

## 2018-10-06 ENCOUNTER — Ambulatory Visit: Payer: BC Managed Care – PPO | Admitting: Cardiology

## 2018-10-06 ENCOUNTER — Other Ambulatory Visit: Payer: Self-pay

## 2018-10-06 VITALS — BP 120/72 | HR 77 | Ht 70.0 in | Wt 186.2 lb

## 2018-10-06 DIAGNOSIS — E785 Hyperlipidemia, unspecified: Secondary | ICD-10-CM

## 2018-10-06 DIAGNOSIS — I2510 Atherosclerotic heart disease of native coronary artery without angina pectoris: Secondary | ICD-10-CM | POA: Diagnosis not present

## 2018-10-06 NOTE — Patient Instructions (Signed)
Medication Instructions:  Your physician recommends that you continue on your current medications as directed. Please refer to the Current Medication list given to you today.  If you need a refill on your cardiac medications before your next appointment, please call your pharmacy.   Lab work: NONE   Testing/Procedures: NONE  Follow-Up: At [facility], you and your health needs are our priority.  As part of our continuing mission to provide you with exceptional heart care, we have created designated Provider Care Teams.  These Care Teams include your primary Cardiologist (physician) and Advanced Practice Providers (APPs -  Physician Assistants and Nurse Practitioners) who all work together to provide you with the care you need, when you need it. You will need a follow up appointment in 12 months.  Please call our office 2 months in advance to schedule this appointment.  You may see DR [facility]  or one of the following Advanced Practice Providers on your designated Care Team:   Kerin Ransom, PA-C [place], [place] . Sande Rives, PA-C

## 2018-11-24 ENCOUNTER — Other Ambulatory Visit: Payer: Self-pay

## 2018-11-24 ENCOUNTER — Ambulatory Visit (INDEPENDENT_AMBULATORY_CARE_PROVIDER_SITE_OTHER): Payer: BC Managed Care – PPO | Admitting: *Deleted

## 2018-11-24 DIAGNOSIS — Z23 Encounter for immunization: Secondary | ICD-10-CM

## 2019-03-22 ENCOUNTER — Other Ambulatory Visit: Payer: Self-pay | Admitting: Family Medicine

## 2019-05-21 ENCOUNTER — Other Ambulatory Visit: Payer: Self-pay | Admitting: Family Medicine

## 2019-05-21 ENCOUNTER — Other Ambulatory Visit: Payer: Self-pay | Admitting: Cardiology

## 2019-05-21 NOTE — Telephone Encounter (Signed)
Ov 07/07/19

## 2019-07-07 ENCOUNTER — Ambulatory Visit: Payer: BC Managed Care – PPO | Admitting: Family Medicine

## 2019-07-07 ENCOUNTER — Encounter: Payer: Self-pay | Admitting: Family Medicine

## 2019-07-07 ENCOUNTER — Other Ambulatory Visit: Payer: Self-pay

## 2019-07-07 VITALS — BP 116/81 | HR 78 | Temp 97.3°F | Ht 70.0 in | Wt 184.0 lb

## 2019-07-07 DIAGNOSIS — Z7689 Persons encountering health services in other specified circumstances: Secondary | ICD-10-CM

## 2019-07-07 DIAGNOSIS — I2510 Atherosclerotic heart disease of native coronary artery without angina pectoris: Secondary | ICD-10-CM

## 2019-07-07 DIAGNOSIS — N400 Enlarged prostate without lower urinary tract symptoms: Secondary | ICD-10-CM

## 2019-07-07 DIAGNOSIS — E782 Mixed hyperlipidemia: Secondary | ICD-10-CM | POA: Diagnosis not present

## 2019-07-07 MED ORDER — SILDENAFIL CITRATE 50 MG PO TABS: 50.0000 mg | ORAL_TABLET | Freq: Every day | ORAL | 2 refills | Status: DC | PRN

## 2019-07-07 NOTE — Progress Notes (Signed)
Subjective: CC:est care, CAD, HLD PCP: Janora Norlander, DO ZLD:JTTSV Kyle Andrade is Kyle 54 y.o. male presenting to clinic today for:  1.  HLD, CAD Patient is followed by Dr. Percival Spanish every December.  He reports compliance with Crestor, Vascepa.  He denies any chest pain, shortness breath, lower extremity IMA, visual disturbance or headache.  He is tolerating exercise without difficulty  2.  BPH Patient reports BPH with slow stream but otherwise is doing fine.  Denies any nocturia, increased urinary frequency.  3.  Erectile dysfunction Patient reports intermittent use of Viagra but does think he probably needs Kyle refill on this.   ROS: Per HPI  Allergies  Allergen Reactions  . Simvastatin     Joint pain  . Lipitor [Atorvastatin Calcium] Other (See Comments)    myalgias   Past Medical History:  Diagnosis Date  . Chest discomfort 03/2012   Sent to ED, had CT scan & was sent home. Mild coronary calcification in the LAD was noted on CT scan.  . Coronary atherosclerosis due to calcified coronary lesion(414.4)   . Hyperlipidemia   . Hyperplasia of prostate   . 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 SOB (shortness of breath) 03/2012   Prior to CP as well  . Unspecified hemorrhoids without mention of complication   . Vitamin D deficiency     Current Outpatient Medications:  .  albuterol (PROVENTIL HFA;VENTOLIN HFA) 108 (90 Base) MCG/ACT inhaler, Inhale 2 puffs into the lungs every 6 (six) hours as needed for wheezing or shortness of breath., Disp: 1 Inhaler, Rfl: 0 .  aspirin 81 MG chewable tablet, Chew 81 mg by mouth daily., Disp: , Rfl:  .  Azelastine HCl 0.15 % SOLN, USE 1 SPRAYS INTO EACH NOSTRIL AS NEEDED FOR NASAL CONGESTION, Disp: 30 mL, Rfl: 3 .  Cholecalciferol (VITAMIN D) 2000 UNITS CAPS, Take 1 capsule by mouth daily., Disp: , Rfl:  .  Famotidine (PEPCID AC PO), Take 1 tablet by mouth daily., Disp: , Rfl:  .  fluticasone (FLONASE) 50 MCG/ACT nasal  spray, USE 2 SPRAYS IN EACH NOSTRIL ONCE DAILY., Disp: 16 g, Rfl: 0 .  fluticasone (FLONASE) 50 MCG/ACT nasal spray, Place 2 sprays into both nostrils daily., Disp: 16 g, Rfl: 6 .  Ibuprofen (ADVIL PO), Take by mouth., Disp: , Rfl:  .  omega-3 acid ethyl esters (LOVAZA) 1 g capsule, TAKE 4 CAPSULES DAILY AS DIRECTED (Patient not taking: Reported on 10/06/2018), Disp: 120 capsule, Rfl: 5 .  rosuvastatin (CRESTOR) 20 MG tablet, Take 1 tablet (20 mg total) by mouth daily. Take 1/2 tablet Monday, Wednesday and Friday., Disp: 90 tablet, Rfl: 0 .  sildenafil (VIAGRA) 50 MG tablet, Take 1 tablet (50 mg total) by mouth daily as needed for erectile dysfunction., Disp: 10 tablet, Rfl: 2 .  VASCEPA 1 g capsule, TAKE (2) CAPSULES TWICE DAILY., Disp: 120 capsule, Rfl: 0 Social History   Socioeconomic History  . Marital status: Married    Spouse name: Kyle Andrade  . Number of children: Not on file  . Years of education: Not on file  . Highest education level: Not on file  Occupational History  . Occupation: Full time    Employer: [employer]  Tobacco Use  . Smoking status: Never Smoker  . Smokeless tobacco: Never Used  Vaping Use  . Vaping Use: Never used  Substance and Sexual Activity  . Alcohol use: Yes    Alcohol/week: 2.0 standard drinks  Types: 2 Cans of beer per week  . Drug use: No  . Sexual activity: Not on file  Other Topics Concern  . Not on file  Social History Narrative  . Not on file   Social Determinants of Health   Financial Resource Strain:   . Difficulty of Paying Living Expenses:   Food Insecurity:   . Worried About Charity fundraiser in the Last Year:   . Arboriculturist in the Last Year:   Transportation Needs:   . Film/video editor (Medical):   Marland Kitchen Lack of Transportation (Non-Medical):   Physical Activity:   . Days of Exercise per Week:   . Minutes of Exercise per Session:   Stress:   . Feeling of Stress :   Social Connections:   . Frequency of  Communication with Friends and Family:   . Frequency of Social Gatherings with Friends and Family:   . Attends Religious Services:   . Active Member of Clubs or Organizations:   . Attends Archivist Meetings:   Marland Kitchen Marital Status:   Intimate Partner Violence:   . Fear of Current or Ex-Partner:   . Emotionally Abused:   Marland Kitchen Physically Abused:   . Sexually Abused:    Family History  Problem Relation Age of Onset  . Healthy Mother   . CVA Mother   . Diabetes Father   . Emphysema Father   . CVA Maternal Grandfather   . CVA Paternal Grandfather   . Colon cancer Neg Hx     Objective: Office vital signs reviewed. BP 116/81   Pulse 78   Temp (!) 97.3 F (36.3 C) (Temporal)   Ht '5\' 10"'$  (1.778 m)   Wt 184 lb (83.5 kg)   SpO2 98%   BMI 26.40 kg/m   Physical Examination:  General: Awake, alert, well nourished, No acute distress HEENT: Normal; sclera white.  Moist use membranes.  ?  Left-sided carotid bruit Cardio: regular rate and rhythm, S1S2 heard, no murmurs appreciated Pulm: clear to auscultation bilaterally, no wheezes, rhonchi or rales; normal work of breathing on room air Extremities: warm, well perfused, No edema, cyanosis or clubbing; +2 pulses bilaterally  Assessment/ Plan: 54 y.o. male   1. Mixed hyperlipidemia Check fasting lipid panel.  Continue statin - CMP14+EGFR - TSH - Lipid Panel  2. Coronary artery disease involving native coronary artery of native heart without angina pectoris Carotids were evaluated under ultrasound in January 2020 which showed no ICA stenosis - CMP14+EGFR - TSH - Lipid Panel  3. Benign prostatic hyperplasia, unspecified whether lower urinary tract symptoms present Check PSA.  Is been greater than 24 hours since last intercourse. - PSA  4. Establishing care with new doctor, encounter for   No orders of the defined types were placed in this encounter.  Meds ordered this encounter  Medications  . sildenafil (VIAGRA) 50  MG tablet    Sig: Take 1 tablet (50 mg total) by mouth daily as needed for erectile dysfunction.    Dispense:  10 tablet    Refill:  [place], DO [place] [phone]

## 2019-07-07 NOTE — Addendum Note (Signed)
Addended by: Raliegh Ip on: 07/07/2019 09:51 AM   Modules accepted: Orders

## 2019-07-07 NOTE — Patient Instructions (Signed)

## 2019-07-08 LAB — PSA: Prostate Specific Ag, Serum: 0.6 ng/mL (ref 0.0–4.0)

## 2019-07-08 LAB — LIPID PANEL
Chol/HDL Ratio: 3.7 ratio (ref 0.0–5.0)
Cholesterol, Total: 132 mg/dL (ref 100–199)
HDL: 36 mg/dL — ABNORMAL LOW (ref >39)
LDL Chol Calc (NIH): 80 mg/dL (ref 0–99)
Triglycerides: 81 mg/dL (ref 0–149)
VLDL Cholesterol Cal: 16 mg/dL (ref 5–40)

## 2019-07-08 LAB — CMP14+EGFR
ALT: 20 IU/L (ref 0–44)
AST: 8 IU/L (ref 0–40)
Albumin/Globulin Ratio: 2.4 — ABNORMAL HIGH (ref 1.2–2.2)
Albumin: 4.4 g/dL (ref 3.8–4.9)
Alkaline Phosphatase: 67 IU/L (ref 48–121)
BUN/Creatinine Ratio: 13 (ref 9–20)
BUN: 14 mg/dL (ref 6–24)
Bilirubin Total: 0.7 mg/dL (ref 0.0–1.2)
CO2: 24 mmol/L (ref 20–29)
Calcium: 9.4 mg/dL (ref 8.7–10.2)
Chloride: 105 mmol/L (ref 96–106)
Creatinine, Ser: 1.09 mg/dL (ref 0.76–1.27)
GFR calc Af Amer: 87 mL/min/{1.73_m2} (ref >59)
GFR calc non Af Amer: 75 mL/min/{1.73_m2} (ref >59)
Globulin, Total: 1.8 g/dL (ref 1.5–4.5)
Glucose: 86 mg/dL (ref 65–99)
Potassium: 4.3 mmol/L (ref 3.5–5.2)
Sodium: 142 mmol/L (ref 134–144)
Total Protein: 6.2 g/dL (ref 6.0–8.5)

## 2019-07-08 LAB — TSH: TSH: 2.66 u[IU]/mL (ref 0.450–4.500)

## 2019-07-23 ENCOUNTER — Other Ambulatory Visit: Payer: Self-pay | Admitting: Family Medicine

## 2019-09-20 ENCOUNTER — Other Ambulatory Visit: Payer: Self-pay | Admitting: Cardiology

## 2019-09-20 ENCOUNTER — Other Ambulatory Visit: Payer: Self-pay | Admitting: Family Medicine

## 2019-10-19 DIAGNOSIS — Z7189 Other specified counseling: Secondary | ICD-10-CM | POA: Insufficient documentation

## 2019-10-19 NOTE — Progress Notes (Signed)
Cardiology Office Note   Date:  10/21/2019   ID:  Kyle Andrade, DOB 09-15-1965, MRN [id number]  PCP:  Raliegh Ip, DO  Cardiologist:   Rollene Rotunda, MD   Chief Complaint  Patient presents with  . Coronary Artery Disease    History of Present Illness: Kyle Andrade is a 54 y.o. male who had a CT in 2014 and was found to have elevated coronary calcium with non obstructive CAD.  He is referred by Raliegh Ip, DO for evaluation of this.   I reviewed the hospital records from 2014.  He presented with chest discomfort.  They did a coronary CT.  He had a calcium score of 122 which was 93rd percentile for age.  There was a 20% LAD stenosis.   His last follow up was a negative POET (Plain Old Exercise Treadmill) in Jan 2019.  He did have some chest discomfort late last year.  I sent him for a CTA in Dec 2019.  He was found to have non obstructive disease.  He was found to have 25 to 50% mild long calcified plaque in the proximal LAD and a focal stenosis of 50 to 69% which was slightly progressed.  He returns for follow up.  Since he was last seen quite well.  He exercises vigorously doing a rowing.  The patient denies any new symptoms such as chest discomfort, neck or arm discomfort. There has been no new shortness of breath, PND or orthopnea. There have been no reported palpitations, presyncope or syncope.   Past Medical History:  Diagnosis Date  . Chest discomfort 03/2012   Sent to ED, had CT scan & was sent home. Mild coronary calcification in the LAD was noted on CT scan.  . Coronary atherosclerosis due to calcified coronary lesion(414.4)   . Hyperlipidemia   . Hyperplasia of prostate   . 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 SOB (shortness of breath) 03/2012   Prior to CP as well  . Unspecified hemorrhoids without mention of complication   . Vitamin D deficiency     Past Surgical History:  Procedure Laterality Date  . URETHRA SURGERY  as a  child     Current Outpatient Medications  Medication Sig Dispense Refill  . albuterol (PROVENTIL HFA;VENTOLIN HFA) 108 (90 Base) MCG/ACT inhaler Inhale 2 puffs into the lungs every 6 (six) hours as needed for wheezing or shortness of breath. 1 Inhaler 0  . aspirin 81 MG chewable tablet Chew 81 mg by mouth daily.    . Azelastine HCl 0.15 % SOLN USE 1 SPRAYS INTO EACH NOSTRIL AS NEEDED FOR NASAL CONGESTION 30 mL 3  . Cholecalciferol (VITAMIN D) 2000 UNITS CAPS Take 1 capsule by mouth daily.    . Famotidine (PEPCID AC PO) Take 1 tablet by mouth daily.    . fluticasone (FLONASE) 50 MCG/ACT nasal spray Place 2 sprays into both nostrils daily. 16 g 6  . Ibuprofen (ADVIL PO) Take by mouth.    . icosapent Ethyl (VASCEPA) 1 g capsule TAKE (2) CAPSULES TWICE DAILY. 120 capsule 2  . omega-3 acid ethyl esters (LOVAZA) 1 g capsule TAKE 4 CAPSULES DAILY AS DIRECTED 120 capsule 5  . rosuvastatin (CRESTOR) 20 MG tablet TAKE 1 TABLET DAILY, TAKE 1/2 TABLET MONDAY, WEDNESDAY, AND FRIDAY 90 tablet 0  . sildenafil (VIAGRA) 50 MG tablet Take 1 tablet (50 mg total) by mouth daily as needed for erectile dysfunction. 10 tablet 2  No current facility-administered medications for this visit.    Allergies:   Simvastatin and Lipitor [atorvastatin calcium]   ROS:  Please see the history of present illness.   Otherwise, review of systems are positive for none.   All other systems are reviewed and negative.    PHYSICAL EXAM: VS:  BP 118/78   Pulse 66   Ht 5\' 10"  (1.778 m)   Wt 185 lb 6.4 oz (84.1 kg)   SpO2 96%   BMI 26.60 kg/m  , BMI Body mass index is 26.6 kg/m. GENERAL:  Well appearing NECK:  No jugular venous distention, waveform within normal limits, carotid upstroke brisk and symmetric, no bruits, no thyromegaly LUNGS:  Clear to auscultation bilaterally CHEST:  Unremarkable HEART:  PMI not displaced or sustained,S1 and S2 within normal limits, no S3, no S4, no clicks, no rubs, no murmurs ABD:  Flat,  positive bowel sounds normal in frequency in pitch, no bruits, no rebound, no guarding, no midline pulsatile mass, no hepatomegaly, no splenomegaly EXT:  2 plus pulses throughout, no edema, no cyanosis no clubbing   EKG:  EKG is ordered today. Normal sinus rhythm, rate 50, axis within normal limits, intervals within normal limits, no acute ST-T wave changes.  Recent Labs: 07/07/2019: ALT 20; BUN 14; Creatinine, Ser 1.09; Potassium 4.3; Sodium 142; TSH 2.660    Lipid Panel    Component Value Date/Time   CHOL 132 07/07/2019 0821   CHOL 118 05/04/2012 0914   TRIG 81 07/07/2019 0821   TRIG 77 11/26/2016 0935   TRIG 80 05/04/2012 0914   HDL 36 (L) 07/07/2019 0821   HDL 38 (L) 11/26/2016 0935   HDL 37 (L) 05/04/2012 0914   CHOLHDL 3.7 07/07/2019 0821   LDLCALC 80 07/07/2019 0821   LDLCALC 77 11/08/2013 0852   LDLCALC 65 05/04/2012 0914      Wt Readings from Last 3 Encounters:  10/21/19 185 lb 6.4 oz (84.1 kg)  07/07/19 184 lb (83.5 kg)  10/06/18 186 lb 3.2 oz (84.5 kg)      Other studies Reviewed: Additional studies/ records that were reviewed today include: Labs Review of the above records demonstrates:   See below.     ASSESSMENT AND PLAN:  CAD:    The patient has no new sypmtoms.  No further cardiovascular testing is indicated.  We will continue with aggressive risk reduction and meds as listed.  DYSLIPIDEMIA:    LDL was 80.  This is higher than I would like.  He had been 61.  It turns out he needs any better every week and I asked him to change visiting every 6 months.   COVID EDUCATION:  He has been vaccinated.    Current medicines are reviewed at length with the patient today.  The patient does not have concerns regarding medicines.  The following changes have been made:   None  Labs/ tests ordered today include:  None  Orders Placed This Encounter  Procedures  . EKG 12-Lead     Disposition:   FU with me in 12 months.  Signed, 10/08/18, MD   10/21/2019 4:35 PM     [facility]

## 2019-10-21 ENCOUNTER — Other Ambulatory Visit: Payer: Self-pay

## 2019-10-21 ENCOUNTER — Encounter: Payer: Self-pay | Admitting: Cardiology

## 2019-10-21 ENCOUNTER — Ambulatory Visit (INDEPENDENT_AMBULATORY_CARE_PROVIDER_SITE_OTHER): Payer: BC Managed Care – PPO | Admitting: Cardiology

## 2019-10-21 VITALS — BP 118/78 | HR 66 | Ht 70.0 in | Wt 185.4 lb

## 2019-10-21 DIAGNOSIS — Z7189 Other specified counseling: Secondary | ICD-10-CM | POA: Diagnosis not present

## 2019-10-21 DIAGNOSIS — I2510 Atherosclerotic heart disease of native coronary artery without angina pectoris: Secondary | ICD-10-CM

## 2019-10-21 DIAGNOSIS — E785 Hyperlipidemia, unspecified: Secondary | ICD-10-CM

## 2019-10-21 NOTE — Patient Instructions (Signed)

## 2019-11-26 ENCOUNTER — Telehealth: Payer: Self-pay | Admitting: Cardiology

## 2019-11-26 MED ORDER — ROSUVASTATIN CALCIUM 20 MG PO TABS: ORAL_TABLET | ORAL | 5 refills | Status: DC

## 2019-11-26 NOTE — Telephone Encounter (Signed)
D/w pt and pharmacy. All questions answered New rx sent as directed

## 2019-11-26 NOTE — Telephone Encounter (Signed)
    Pt c/o medication issue:  1. Name of Medication:   rosuvastatin (CRESTOR) 20 MG tablet    2. How are you currently taking this medication (dosage and times per day)? TAKE 1 TABLET DAILY, TAKE 1/2 TABLET MONDAY, WEDNESDAY, AND FRIDAY  3. Are you having a reaction (difficulty breathing--STAT)?   4. What is your medication issue? [facility] calling, she said pt told them Dr. Virgina Jock change his dosage to 1 table a day then half a tablet on weekends. They need new prescription send it to them, hopefully today since pt is out of medicarions.

## 2020-01-24 ENCOUNTER — Other Ambulatory Visit: Payer: Self-pay | Admitting: *Deleted

## 2020-01-24 MED ORDER — FLUTICASONE PROPIONATE 50 MCG/ACT NA SUSP: 2.0000 | Freq: Every day | NASAL | 6 refills | Status: DC

## 2020-03-21 ENCOUNTER — Other Ambulatory Visit: Payer: Self-pay | Admitting: Family Medicine

## 2020-05-08 ENCOUNTER — Other Ambulatory Visit: Payer: Self-pay | Admitting: Family Medicine

## 2020-05-15 ENCOUNTER — Other Ambulatory Visit: Payer: Self-pay | Admitting: *Deleted

## 2020-05-15 DIAGNOSIS — E782 Mixed hyperlipidemia: Secondary | ICD-10-CM

## 2020-05-15 MED ORDER — OMEGA-3-ACID ETHYL ESTERS 1 G PO CAPS: ORAL_CAPSULE | ORAL | 6 refills | Status: DC

## 2020-05-15 NOTE — Telephone Encounter (Signed)
Key: [id number] Omega-3-acid Ethyl Esters 1GM capsules Sent to plan

## 2020-07-26 IMAGING — CT CT HEART MORP W/ CTA COR W/ SCORE W/ CA W/CM &/OR W/O CM
4 of 7 series · 8 of 20 positions shown, 9 images · IV contrast (APPLIED)
Comparison: Chest radiograph 11/26/2016. Cardiac CTA of 02/26/2012.

Addendum:
EXAM:
OVER-READ INTERPRETATION  CT CHEST

The following report is an over-read performed by radiologist Dr.
over-read does not include interpretation of cardiac or coronary
anatomy or pathology. The coronary CTA interpretation by the
cardiologist is attached.
CLINICAL DATA: 52-year-old male with hyperlipidemia, family history
of premature CAD and atypical chest pain.
Cardiac/Coronary  CT
TECHNIQUE: The patient was scanned on a Phillips Force scanner.

[Series 6: best diast 73 % · axial · 0.39mm/px · z∈[+1161,+1216]mm · 2 of 412 slices shown, 3 images]
[im 138/412  vessel]
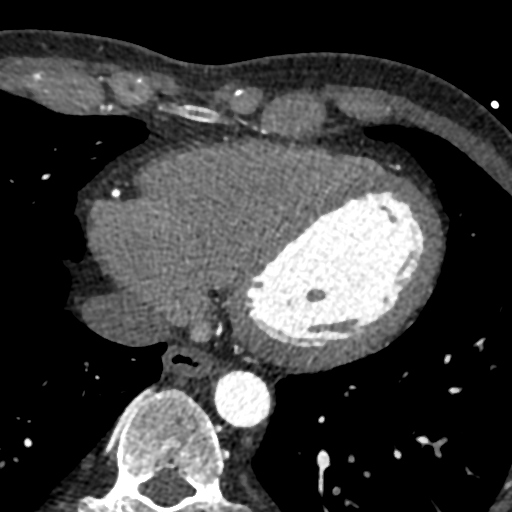
[im 138/412  lung]
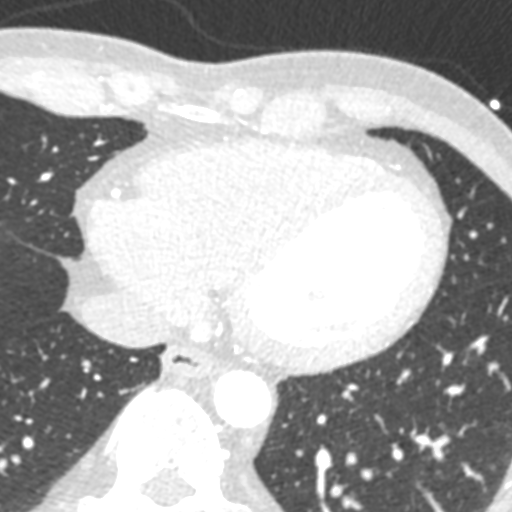
[im 275/412  vessel]
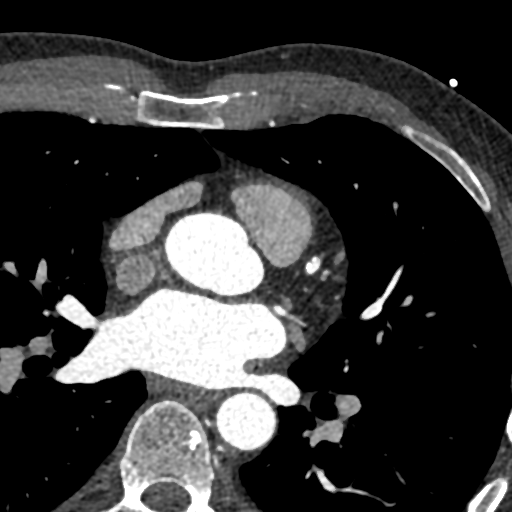

[Series 7: best syst 35 % · axial · 0.39mm/px · z∈[+1161,+1216]mm · 2 of 412 slices shown]
[im 138/412  vessel]
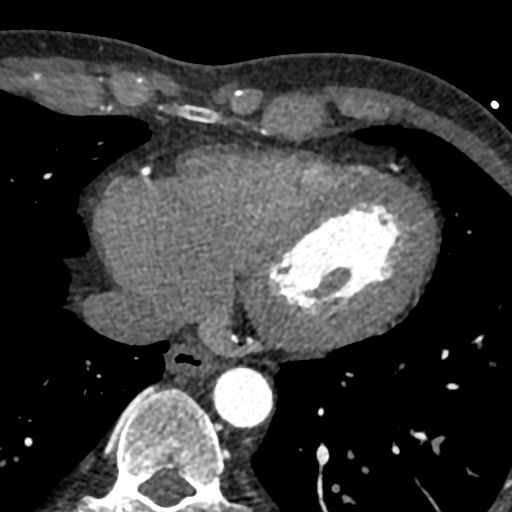
[im 275/412  vessel]
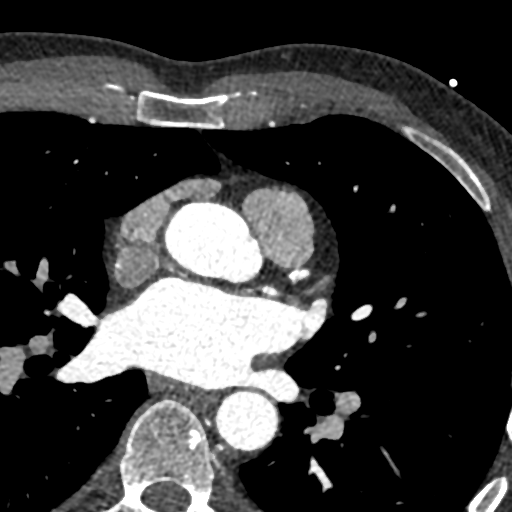

[Series 8: ts diast sharp 73 % · axial · 0.39mm/px · z∈[+1161,+1216]mm · 2 of 412 slices shown]
[im 138/412  lung]
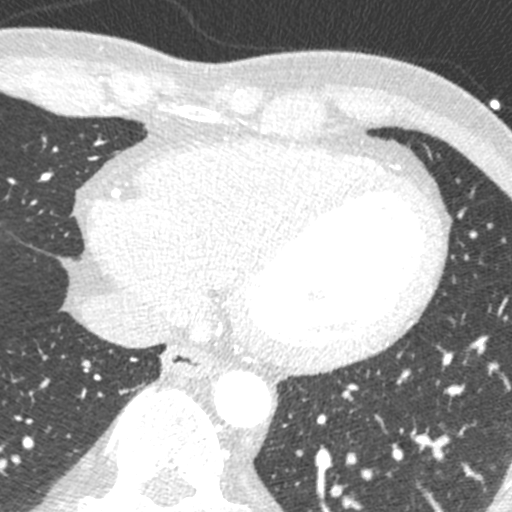
[im 275/412  lung]
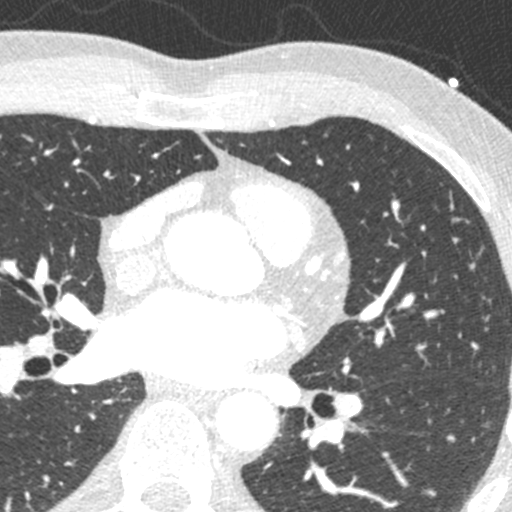

[Series 9: ts syst sharp 35 % · axial · 0.39mm/px · z∈[+1161,+1216]mm · 2 of 412 slices shown]
[im 138/412  lung]
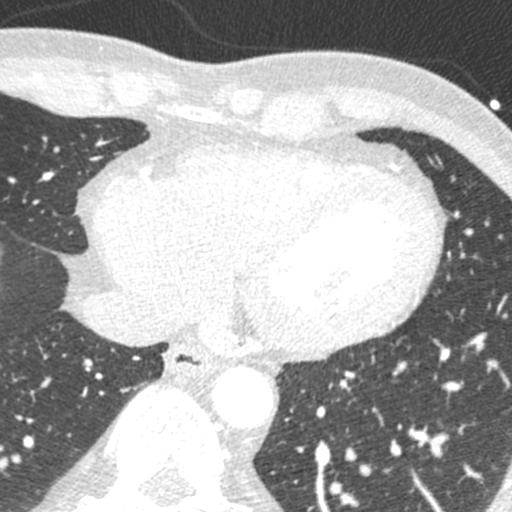
[im 275/412  lung]
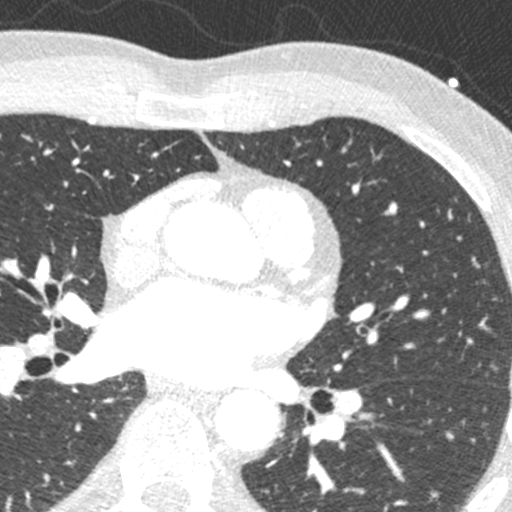

[8 of 20 positions shown; findings below may reference images not displayed]

FINDINGS: Vascular: Normal aortic caliber, without dissection. Mild aortic
tortuosity. No central pulmonary embolism, on this non-dedicated
study.

Mediastinum/Nodes: No imaged thoracic adenopathy.

Lungs/Pleura: No pleural fluid. Clear imaged lungs.

Upper Abdomen: Normal imaged portions of the liver, spleen, stomach.

Musculoskeletal: No acute osseous abnormality.
IMPRESSION: No acute extracardiac findings in the imaged chest.
FINDINGS: A 120 kV prospective scan was triggered in the descending thoracic
aorta at 111 HU's. Axial non-contrast 3 mm slices were carried out
through the heart. The data set was analyzed on a dedicated work
station and scored using the Agatson method. Gantry rotation speed
was 250 msecs and collimation was .6 mm. No beta blockade and 0.8 mg
of sl NTG was given. The 3D data set was reconstructed in 5%
intervals of the 67-82 % of the R-R cycle. Diastolic phases were
analyzed on a dedicated work station using MPR, MIP and VRT modes.
The patient received 80 cc of contrast.

Aorta:  Normal size.  Trivial calcifications.  No dissection.

Aortic Valve:  Trileaflet.  Trivial leaflet calcifications.

Coronary Arteries:  Normal coronary origin.  Right dominance.

RCA is a large dominant artery that gives rise to PDA and PLVB.
There are only minimal irregularities

Left main is a short artery that gives rise to LAD and LCX arteries.
LM has no plaque.

LAD is a large vessel that gives rise to one small diagonal artery
and wraps around the apex. Proximal LAD has mild long predominantly
calcified plaque with stenosis 25-50% and a focal stenosis of
50-69%.

LCX is a non-dominant artery that gives rise to one large OM1
branch. There is no plaque.

Other findings:

Normal pulmonary vein drainage into the left atrium.

Normal let atrial appendage without a thrombus.

Normal size of the pulmonary artery.
IMPRESSION: 1. Coronary calcium score of 188. This was 95 percentile for age and
sex matched control.

2. Normal coronary origin with right dominance.

3. Mild to moderate non-obstructive diffuse plaque in the proximal
LAD. Aggressive medical management is recommended.

*** End of Addendum ***

## 2020-08-11 ENCOUNTER — Telehealth: Payer: Self-pay | Admitting: Family Medicine

## 2020-08-11 DIAGNOSIS — N400 Enlarged prostate without lower urinary tract symptoms: Secondary | ICD-10-CM

## 2020-08-11 DIAGNOSIS — E782 Mixed hyperlipidemia: Secondary | ICD-10-CM

## 2020-08-11 NOTE — Telephone Encounter (Signed)
Future orders placed for labs, patient aware

## 2020-10-03 ENCOUNTER — Other Ambulatory Visit: Payer: Self-pay

## 2020-10-03 ENCOUNTER — Other Ambulatory Visit: Payer: BC Managed Care – PPO

## 2020-10-03 DIAGNOSIS — E782 Mixed hyperlipidemia: Secondary | ICD-10-CM

## 2020-10-03 DIAGNOSIS — N400 Enlarged prostate without lower urinary tract symptoms: Secondary | ICD-10-CM

## 2020-10-04 LAB — PSA, TOTAL AND FREE
PSA, Free Pct: 45.7 %
PSA, Free: 0.32 ng/mL
Prostate Specific Ag, Serum: 0.7 ng/mL (ref 0.0–4.0)

## 2020-10-04 LAB — CMP14+EGFR
ALT: 20 IU/L (ref 0–44)
AST: 8 IU/L (ref 0–40)
Albumin/Globulin Ratio: 2.4 — ABNORMAL HIGH (ref 1.2–2.2)
Albumin: 4.5 g/dL (ref 3.8–4.9)
Alkaline Phosphatase: 61 IU/L (ref 44–121)
BUN/Creatinine Ratio: 14 (ref 9–20)
BUN: 15 mg/dL (ref 6–24)
Bilirubin Total: 0.7 mg/dL (ref 0.0–1.2)
CO2: 24 mmol/L (ref 20–29)
Calcium: 9.7 mg/dL (ref 8.7–10.2)
Chloride: 104 mmol/L (ref 96–106)
Creatinine, Ser: 1.1 mg/dL (ref 0.76–1.27)
Globulin, Total: 1.9 g/dL (ref 1.5–4.5)
Glucose: 92 mg/dL (ref 65–99)
Potassium: 5 mmol/L (ref 3.5–5.2)
Sodium: 142 mmol/L (ref 134–144)
Total Protein: 6.4 g/dL (ref 6.0–8.5)
eGFR: 78 mL/min/{1.73_m2} (ref >59)

## 2020-10-04 LAB — CBC WITH DIFFERENTIAL/PLATELET
Basophils Absolute: 0 10*3/uL (ref 0.0–0.2)
Basos: 1 %
EOS (ABSOLUTE): 0.1 10*3/uL (ref 0.0–0.4)
Eos: 1 %
Hematocrit: 43.5 % (ref 37.5–51.0)
Hemoglobin: 15.6 g/dL (ref 13.0–17.7)
Immature Grans (Abs): 0 10*3/uL (ref 0.0–0.1)
Immature Granulocytes: 0 %
Lymphocytes Absolute: 1.4 10*3/uL (ref 0.7–3.1)
Lymphs: 33 %
MCH: 32 pg (ref 26.6–33.0)
MCHC: 35.9 g/dL — ABNORMAL HIGH (ref 31.5–35.7)
MCV: 89 fL (ref 79–97)
Monocytes Absolute: 0.4 10*3/uL (ref 0.1–0.9)
Monocytes: 9 %
Neutrophils Absolute: 2.5 10*3/uL (ref 1.4–7.0)
Neutrophils: 56 %
Platelets: 150 10*3/uL (ref 150–450)
RBC: 4.87 x10E6/uL (ref 4.14–5.80)
RDW: 12 % (ref 11.6–15.4)
WBC: 4.4 10*3/uL (ref 3.4–10.8)

## 2020-10-04 LAB — LIPID PANEL
Chol/HDL Ratio: 3.2 ratio (ref 0.0–5.0)
Cholesterol, Total: 130 mg/dL (ref 100–199)
HDL: 41 mg/dL (ref >39)
LDL Chol Calc (NIH): 73 mg/dL (ref 0–99)
Triglycerides: 80 mg/dL (ref 0–149)
VLDL Cholesterol Cal: 16 mg/dL (ref 5–40)

## 2020-10-11 ENCOUNTER — Encounter: Payer: Self-pay | Admitting: Family Medicine

## 2020-10-11 ENCOUNTER — Other Ambulatory Visit: Payer: Self-pay

## 2020-10-11 ENCOUNTER — Ambulatory Visit (INDEPENDENT_AMBULATORY_CARE_PROVIDER_SITE_OTHER): Payer: BC Managed Care – PPO | Admitting: Family Medicine

## 2020-10-11 VITALS — BP 128/85 | HR 73 | Temp 96.0°F | Ht 70.0 in | Wt 186.6 lb

## 2020-10-11 DIAGNOSIS — N400 Enlarged prostate without lower urinary tract symptoms: Secondary | ICD-10-CM | POA: Diagnosis not present

## 2020-10-11 DIAGNOSIS — E782 Mixed hyperlipidemia: Secondary | ICD-10-CM

## 2020-10-11 DIAGNOSIS — Z0001 Encounter for general adult medical examination with abnormal findings: Secondary | ICD-10-CM | POA: Diagnosis not present

## 2020-10-11 DIAGNOSIS — G479 Sleep disorder, unspecified: Secondary | ICD-10-CM

## 2020-10-11 DIAGNOSIS — Z23 Encounter for immunization: Secondary | ICD-10-CM | POA: Diagnosis not present

## 2020-10-11 DIAGNOSIS — I2510 Atherosclerotic heart disease of native coronary artery without angina pectoris: Secondary | ICD-10-CM | POA: Diagnosis not present

## 2020-10-11 DIAGNOSIS — Z0000 Encounter for general adult medical examination without abnormal findings: Secondary | ICD-10-CM

## 2020-10-11 MED ORDER — OMEGA-3-ACID ETHYL ESTERS 1 G PO CAPS: 2.0000 g | ORAL_CAPSULE | Freq: Two times a day (BID) | ORAL | 3 refills | Status: DC

## 2020-10-11 MED ORDER — SILDENAFIL CITRATE 50 MG PO TABS: 50.0000 mg | ORAL_TABLET | Freq: Every day | ORAL | 2 refills | Status: DC | PRN

## 2020-10-11 NOTE — Progress Notes (Signed)
Kyle Andrade is a 55 y.o. male presents to office today for annual physical exam examination.    Concerns today include: 1.  Sleep difficulty Patient reports over the last year has had some difficulty with sleep.  Prior to this past year he was able to fall asleep and stay asleep until his alarm went off but recently he has been having some issues with waking up multiple times per night.  He denies any nocturia.  He admits to some increased stressors.  His only son will be getting married soon.    Occupation: Art gallery manager, Marital status: Married, Substance use: 1 beer per night with supper Diet: balanced, Exercise: active Colonoscopy: UTD Refills needed today: not sure Immunizations needed: Flu shot, shingles vaccine Immunization History  Administered Date(s) Administered   Influenza Nasal 11/05/2009   Influenza,inj,Quad PF,6+ Mos 11/02/2012, 11/08/2013, 11/14/2014, 11/15/2015, 11/12/2016, 11/03/2017, 11/24/2018   PFIZER(Purple Top)SARS-COV-2 Vaccination 04/15/2019, 05/10/2019   Pneumococcal Conjugate-13 11/14/2014   Td 11/22/2003, 10/25/2010   Tdap 10/25/2010    Past Medical History:  Diagnosis Date   Chest discomfort 03/2012   Sent to ED, had CT scan & was sent home. Mild coronary calcification in the LAD was noted on CT scan.   Coronary atherosclerosis due to calcified coronary lesion(414.4)    Hyperlipidemia    Hyperplasia of prostate    Rheumatic fever without mention of heart involvement    SOB (shortness of breath) 03/2012   Prior to CP as well   Unspecified hemorrhoids without mention of complication    Vitamin D deficiency    Social History   Socioeconomic History   Marital status: Married    Spouse name: [name]   Number of children: Not on file   Years of education: Not on file   Highest education level: Not on file  Occupational History   Occupation: Full time    Employer: [employer]  Tobacco Use   Smoking status: Never   Smokeless tobacco: Never   Vaping Use   Vaping Use: Never used  Substance and Sexual Activity   Alcohol use: Yes    Alcohol/week: 2.0 standard drinks    Types: 2 Cans of beer per week   Drug use: No   Sexual activity: Not on file  Other Topics Concern   Not on file  Social History Narrative   Not on file   Social Determinants of Health   Financial Resource Strain: Not on file  Food Insecurity: Not on file  Transportation Needs: Not on file  Physical Activity: Not on file  Stress: Not on file  Social Connections: Not on file  Intimate Partner Violence: Not on file   Past Surgical History:  Procedure Laterality Date   URETHRA SURGERY  as a child   Family History  Problem Relation Age of Onset   Healthy Mother    CVA Mother    Diabetes Father    Emphysema Father    CVA Maternal Grandfather    CVA Paternal Grandfather    Colon cancer Neg Hx     Current Outpatient Medications:    albuterol (PROVENTIL HFA;VENTOLIN HFA) 108 (90 Base) MCG/ACT inhaler, Inhale 2 puffs into the lungs every 6 (six) hours as needed for wheezing or shortness of breath., Disp: 1 Inhaler, Rfl: 0   aspirin 81 MG chewable tablet, Chew 81 mg by mouth daily., Disp: , Rfl:    Azelastine HCl 0.15 % SOLN, USE 1 SPRAYS INTO EACH NOSTRIL AS NEEDED FOR NASAL CONGESTION, Disp: 30  mL, Rfl: 3   Cholecalciferol (VITAMIN D) 2000 UNITS CAPS, Take 1 capsule by mouth daily., Disp: , Rfl:    Famotidine (PEPCID AC PO), Take 1 tablet by mouth daily., Disp: , Rfl:    fluticasone (FLONASE) 50 MCG/ACT nasal spray, Place 2 sprays into both nostrils daily., Disp: 16 g, Rfl: 6   Ibuprofen (ADVIL PO), Take by mouth., Disp: , Rfl:    icosapent Ethyl (VASCEPA) 1 g capsule, Take 2 capsules (2 g total) by mouth 2 (two) times daily. (NEEDS TO BE SEEN BEFORE NEXT REFILL), Disp: 120 capsule, Rfl: 0   omega-3 acid ethyl esters (LOVAZA) 1 g capsule, TAKE 4 CAPSULES DAILY AS DIRECTED, Disp: 120 capsule, Rfl: 6   rosuvastatin (CRESTOR) 20 MG tablet, TAKE 1 TABLET  DAILY, TAKE 1/2 TABLET MONDAY, WEDNESDAY, AND FRIDAY, Disp: 66 tablet, Rfl: 5   sildenafil (VIAGRA) 50 MG tablet, Take 1 tablet (50 mg total) by mouth daily as needed for erectile dysfunction., Disp: 10 tablet, Rfl: 2  Allergies  Allergen Reactions   Simvastatin     Joint pain   Lipitor [Atorvastatin Calcium] Other (See Comments)    myalgias     ROS: Review of Systems Pertinent items noted in HPI and remainder of comprehensive ROS otherwise negative.    Physical exam BP 128/85   Pulse 73   Temp (!) 96 F (35.6 C)   Ht $R'5\' 10"'Uh$  (1.778 m)   Wt 186 lb 9.6 oz (84.6 kg)   SpO2 97%   BMI 26.77 kg/m  General appearance: alert, cooperative, appears stated age, and no distress Head: Normocephalic, without obvious abnormality, atraumatic Eyes: negative findings: lids and lashes normal, conjunctivae and sclerae normal, corneas clear, and pupils equal, round, reactive to light and accomodation Ears: normal TM's and external ear canals both ears Nose: Nares normal. Septum midline. Mucosa normal. No drainage or sinus tenderness. Throat: lips, mucosa, and tongue normal; teeth and gums normal Neck: no adenopathy, no carotid bruit, supple, symmetrical, trachea midline, and thyroid not enlarged, symmetric, no tenderness/mass/nodules Back: symmetric, no curvature. ROM normal. No CVA tenderness. Lungs: clear to auscultation bilaterally Chest wall: no tenderness Heart: regular rate and rhythm, S1, S2 normal, no murmur, click, rub or gallop Abdomen: soft, non-tender; bowel sounds normal; no masses,  no organomegaly Extremities: extremities normal, atraumatic, no cyanosis or edema Pulses: 2+ and symmetric Skin:  Visible skin damage on the upper extremities.  Has some lesions that look consistent with a seborrheic keratosis. Lymph nodes: Cervical, supraclavicular, and axillary nodes normal. Neurologic: Grossly normal Psych: Mood stable, speech normal  Assessment/ Plan: Kyle Andrade here for  annual physical exam.   Annual physical exam  Mixed hyperlipidemia - Plan: omega-3 acid ethyl esters (LOVAZA) 1 g capsule, Lipid panel, CMP14+EGFR, TSH  Benign prostatic hyperplasia without lower urinary tract symptoms - Plan: PSA, CBC  Coronary artery disease involving native coronary artery of native heart without angina pectoris - Plan: omega-3 acid ethyl esters (LOVAZA) 1 g capsule, Lipid panel, CMP14+EGFR, TSH  Sleep difficulties  Influenza vaccination administered today.  His labs look nice and stable.  Will CC to primary cardiologist.  Keep appointment with dermatology for full body skin exam this year.  Asymptomatic from prostate standpoint.  PSA was appropriate.  With regards to sleep difficulty, I think trial of melatonin is appropriate.  No lifestyle modifications that I could identify that would help with his sleep.  Wonder if some of the anxiety surrounding his son's upcoming wedding is impacting some of  his sleep.  We discussed consideration for trazodone if melatonin ineffective and he will contact me should he need that medicine  I have placed future labs for next year.  He may come fasting at his convenience prior to her next annual physical  Patient to follow up in 1 year for annual exam or sooner if needed.  Ettel Albergo M. Lajuana Ripple, DO

## 2021-01-25 ENCOUNTER — Other Ambulatory Visit: Payer: Self-pay | Admitting: Cardiology

## 2021-01-26 ENCOUNTER — Telehealth: Payer: Self-pay | Admitting: Cardiology

## 2021-01-26 MED ORDER — ROSUVASTATIN CALCIUM 20 MG PO TABS: ORAL_TABLET | ORAL | 5 refills | Status: DC

## 2021-01-26 NOTE — Telephone Encounter (Signed)
°*  STAT* If patient is at the pharmacy, call can be transferred to refill team.   1. Which medications need to be refilled? (please list name of each medication and dose if known) Rosuvastatin  2. Which pharmacy/location (including street and city if local pharmacy) is medication to be sent to?[id number]   3. Do they need a 30 day or 90 day supply? Enough until his appointment on 02-13-21

## 2021-02-12 NOTE — Progress Notes (Signed)
Cardiology Office Note   Date:  02/13/2021   ID:  Kyle Andrade, DOB 1965-09-10, MRN [id number]  PCP:  Janora Norlander, DO  Cardiologist:   Minus Breeding, MD   Chief Complaint  Patient presents with   Coronary Artery Disease    History of Present Illness: Kyle Andrade is a 56 y.o. male who had a CT in 2014 and was found to have elevated coronary calcium with non obstructive CAD.  He is referred by Janora Norlander, DO for evaluation of this.   I reviewed the hospital records from 2014.  He presented with chest discomfort.  They did a coronary CT.  He had a calcium score of 122 which was 93rd percentile for age.  There was a 20% LAD stenosis.   His last follow up was a negative POET (Plain Old Exercise Treadmill) in Jan 2019.  He did have some chest discomfort late last year.  I sent him for a CTA in Dec 2019.  He was found to have non obstructive disease.  He was found to have 25 to 50% mild long calcified plaque in the proximal LAD and a focal stenosis of 50 to 69% which was slightly progressed.     He returns for follow up.  Since I last saw him he has done well.  He still exercises and does his rowing machine.  He runs.  He is taking care of some aging parents which gives him significant anxiety.  The patient denies any new symptoms such as chest discomfort, neck or arm discomfort. There has been no new shortness of breath, PND or orthopnea. There have been no reported palpitations, presyncope or syncope.    Past Medical History:  Diagnosis Date   Chest discomfort 03/2012   Sent to ED, had CT scan & was sent home. Mild coronary calcification in the LAD was noted on CT scan.   Coronary atherosclerosis due to calcified coronary lesion(414.4)    Hyperlipidemia    Hyperplasia of prostate    Rheumatic fever without mention of heart involvement    SOB (shortness of breath) 03/2012   Prior to CP as well   Unspecified hemorrhoids without mention of complication    Vitamin  D deficiency     Past Surgical History:  Procedure Laterality Date   URETHRA SURGERY  as a child     Current Outpatient Medications  Medication Sig Dispense Refill   aspirin 81 MG chewable tablet Chew 81 mg by mouth daily.     Azelastine HCl 0.15 % SOLN USE 1 SPRAYS INTO EACH NOSTRIL AS NEEDED FOR NASAL CONGESTION 30 mL 3   Cholecalciferol (VITAMIN D) 2000 UNITS CAPS Take 1 capsule by mouth daily.     fluticasone (FLONASE) 50 MCG/ACT nasal spray Place 2 sprays into both nostrils daily. 16 g 6   Ibuprofen (ADVIL PO) Take by mouth.     omega-3 acid ethyl esters (LOVAZA) 1 g capsule Take 2 capsules (2 g total) by mouth 2 (two) times daily. 360 capsule 3   rosuvastatin (CRESTOR) 20 MG tablet TAKE 1 TABLET DAILY, TAKE 1/2 TABLET MONDAY, WEDNESDAY, AND FRIDAY 66 tablet 5   sildenafil (VIAGRA) 50 MG tablet Take 1 tablet (50 mg total) by mouth daily as needed for erectile dysfunction (place on file). 10 tablet 2   No current facility-administered medications for this visit.    Allergies:   Simvastatin and Lipitor [atorvastatin calcium]   ROS:  Please see the history of  present illness.   Otherwise, review of systems are positive for none.   All other systems are reviewed and negative.    PHYSICAL EXAM: VS:  BP 124/90    Pulse 67    Ht 5\' 10"  (1.778 m)    Wt 185 lb 9.6 oz (84.2 kg)    BMI 26.63 kg/m  , BMI Body mass index is 26.63 kg/m. GENERAL:  Well appearing NECK:  No jugular venous distention, waveform within normal limits, carotid upstroke brisk and symmetric, no bruits, no thyromegaly LUNGS:  Clear to auscultation bilaterally CHEST:  Unremarkable HEART:  PMI not displaced or sustained,S1 and S2 within normal limits, no S3, no S4, no clicks, no rubs, no murmurs ABD:  Flat, positive bowel sounds normal in frequency in pitch, no bruits, no rebound, no guarding, no midline pulsatile mass, no hepatomegaly, no splenomegaly EXT:  2 plus pulses throughout, no edema, no cyanosis no  clubbing  EKG:  EKG is   ordered today. Normal sinus rhythm, rate 67, axis within normal limits, intervals within normal limits, no acute ST-T wave changes.  Recent Labs: 10/03/2020: ALT 20; BUN 15; Creatinine, Ser 1.10; Hemoglobin 15.6; Platelets 150; Potassium 5.0; Sodium 142    Lipid Panel    Component Value Date/Time   CHOL 130 10/03/2020 0811   CHOL 118 05/04/2012 0914   TRIG 80 10/03/2020 0811   TRIG 77 11/26/2016 0935   TRIG 80 05/04/2012 0914   HDL 41 10/03/2020 0811   HDL 38 (L) 11/26/2016 0935   HDL 37 (L) 05/04/2012 0914   CHOLHDL 3.2 10/03/2020 0811   LDLCALC 73 10/03/2020 0811   LDLCALC 77 11/08/2013 0852   LDLCALC 65 05/04/2012 0914      Wt Readings from Last 3 Encounters:  02/13/21 185 lb 9.6 oz (84.2 kg)  10/11/20 186 lb 9.6 oz (84.6 kg)  10/21/19 185 lb 6.4 oz (84.1 kg)      Other studies Reviewed: Additional studies/ records that were reviewed today include: Labs Review of the above records demonstrates:   See elsewhere   ASSESSMENT AND PLAN:  CAD:    The patient has no new sypmtoms.  No further cardiovascular testing is indicated.  We will continue with aggressive risk reduction and meds as listed.  DYSLIPIDEMIA:    LDL was 73.  No change in therapy.   We again talked about diet.  He tolerates the low-dose of statin and agrees to continue this.   Current medicines are reviewed at length with the patient today.  The patient does not have concerns regarding medicines.  The following changes have been made:   None  Labs/ tests ordered today include:    Orders Placed This Encounter  Procedures   EKG 12-Lead    Disposition:   FU with me in 12 months.   Signed, Minus Breeding, MD  02/13/2021 5:07 PM    [place]

## 2021-02-13 ENCOUNTER — Ambulatory Visit: Payer: BC Managed Care – PPO | Admitting: Cardiology

## 2021-02-13 ENCOUNTER — Other Ambulatory Visit: Payer: Self-pay

## 2021-02-13 ENCOUNTER — Encounter: Payer: Self-pay | Admitting: Cardiology

## 2021-02-13 VITALS — BP 124/90 | HR 67 | Ht 70.0 in | Wt 185.6 lb

## 2021-02-13 DIAGNOSIS — I2510 Atherosclerotic heart disease of native coronary artery without angina pectoris: Secondary | ICD-10-CM | POA: Diagnosis not present

## 2021-02-13 DIAGNOSIS — E785 Hyperlipidemia, unspecified: Secondary | ICD-10-CM | POA: Diagnosis not present

## 2021-02-13 NOTE — Patient Instructions (Signed)
Medication Instructions:  ?No changes ?*If you need a refill on your cardiac medications before your next appointment, please call your pharmacy* ? ? ?Lab Work: ?None ordered ?If you have labs (blood work) drawn today and your tests are completely normal, you will receive your results only by: ?MyChart Message (if you have MyChart) OR ?A paper copy in the mail ?If you have any lab test that is abnormal or we need to change your treatment, we will call you to review the results. ? ? ?Testing/Procedures: ?None ordered ? ? ?Follow-Up: ?At [facility], you and your health needs are our priority.  As part of our continuing mission to provide you with exceptional heart care, we have created designated Provider Care Teams.  These Care Teams include your primary Cardiologist (physician) and Advanced Practice Providers (APPs -  Physician Assistants and Nurse Practitioners) who all work together to provide you with the care you need, when you need it. ? ?We recommend signing up for the patient portal called "MyChart".  Sign up information is provided on this After Visit Summary.  MyChart is used to connect with patients for Virtual Visits (Telemedicine).  Patients are able to view lab/test results, encounter notes, upcoming appointments, etc.  Non-urgent messages can be sent to your provider as well.   ?To learn more about what you can do with MyChart, go to [email or web address].   ? ?Your next appointment:   ?12 month(s) ? ?The format for your next appointment:   ?In Person ? ?Provider:   ?James Hochrein, MD { ? ? ?

## 2021-06-04 ENCOUNTER — Telehealth: Payer: Self-pay | Admitting: *Deleted

## 2021-06-04 NOTE — Telephone Encounter (Signed)
Ever Liera ? ?(Key: [id number]) ? ?Vascepa 1GM capsules ? ?Sent to plan ?

## 2021-06-04 NOTE — Telephone Encounter (Signed)
APPROVED

## 2021-07-27 ENCOUNTER — Telehealth: Payer: Self-pay | Admitting: Family Medicine

## 2021-07-27 DIAGNOSIS — N400 Enlarged prostate without lower urinary tract symptoms: Secondary | ICD-10-CM

## 2021-07-27 DIAGNOSIS — E782 Mixed hyperlipidemia: Secondary | ICD-10-CM

## 2021-07-27 NOTE — Telephone Encounter (Signed)
Spoke with patient and advised labs have been ordered.

## 2021-10-12 ENCOUNTER — Other Ambulatory Visit: Payer: BC Managed Care – PPO

## 2021-10-12 DIAGNOSIS — N400 Enlarged prostate without lower urinary tract symptoms: Secondary | ICD-10-CM

## 2021-10-12 DIAGNOSIS — E782 Mixed hyperlipidemia: Secondary | ICD-10-CM

## 2021-10-13 LAB — CMP14+EGFR
ALT: 22 IU/L (ref 0–44)
AST: 10 IU/L (ref 0–40)
Albumin/Globulin Ratio: 2.7 — ABNORMAL HIGH (ref 1.2–2.2)
Albumin: 4.3 g/dL (ref 3.8–4.9)
Alkaline Phosphatase: 61 IU/L (ref 44–121)
BUN/Creatinine Ratio: 9 (ref 9–20)
BUN: 10 mg/dL (ref 6–24)
Bilirubin Total: 0.4 mg/dL (ref 0.0–1.2)
CO2: 23 mmol/L (ref 20–29)
Calcium: 9.2 mg/dL (ref 8.7–10.2)
Chloride: 108 mmol/L — ABNORMAL HIGH (ref 96–106)
Creatinine, Ser: 1.16 mg/dL (ref 0.76–1.27)
Globulin, Total: 1.6 g/dL (ref 1.5–4.5)
Glucose: 91 mg/dL (ref 70–99)
Potassium: 4.8 mmol/L (ref 3.5–5.2)
Sodium: 144 mmol/L (ref 134–144)
Total Protein: 5.9 g/dL — ABNORMAL LOW (ref 6.0–8.5)
eGFR: 73 mL/min/{1.73_m2} (ref >59)

## 2021-10-13 LAB — CBC WITH DIFFERENTIAL/PLATELET
Basophils Absolute: 0 10*3/uL (ref 0.0–0.2)
Basos: 1 %
EOS (ABSOLUTE): 0.1 10*3/uL (ref 0.0–0.4)
Eos: 2 %
Hematocrit: 43.2 % (ref 37.5–51.0)
Hemoglobin: 15 g/dL (ref 13.0–17.7)
Immature Grans (Abs): 0 10*3/uL (ref 0.0–0.1)
Immature Granulocytes: 0 %
Lymphocytes Absolute: 1.3 10*3/uL (ref 0.7–3.1)
Lymphs: 34 %
MCH: 32.3 pg (ref 26.6–33.0)
MCHC: 34.7 g/dL (ref 31.5–35.7)
MCV: 93 fL (ref 79–97)
Monocytes Absolute: 0.3 10*3/uL (ref 0.1–0.9)
Monocytes: 9 %
Neutrophils Absolute: 2.1 10*3/uL (ref 1.4–7.0)
Neutrophils: 54 %
Platelets: 133 10*3/uL — ABNORMAL LOW (ref 150–450)
RBC: 4.64 x10E6/uL (ref 4.14–5.80)
RDW: 12.5 % (ref 11.6–15.4)
WBC: 3.9 10*3/uL (ref 3.4–10.8)

## 2021-10-13 LAB — LIPID PANEL
Chol/HDL Ratio: 2.7 ratio (ref 0.0–5.0)
Cholesterol, Total: 115 mg/dL (ref 100–199)
HDL: 42 mg/dL (ref >39)
LDL Chol Calc (NIH): 60 mg/dL (ref 0–99)
Triglycerides: 56 mg/dL (ref 0–149)
VLDL Cholesterol Cal: 13 mg/dL (ref 5–40)

## 2021-10-13 LAB — PSA, TOTAL AND FREE
PSA, Free Pct: 50 %
PSA, Free: 0.3 ng/mL
Prostate Specific Ag, Serum: 0.6 ng/mL (ref 0.0–4.0)

## 2021-10-16 ENCOUNTER — Encounter: Payer: Self-pay | Admitting: Family Medicine

## 2021-10-16 ENCOUNTER — Ambulatory Visit (INDEPENDENT_AMBULATORY_CARE_PROVIDER_SITE_OTHER): Payer: BC Managed Care – PPO | Admitting: Family Medicine

## 2021-10-16 VITALS — BP 119/76 | HR 58 | Temp 97.4°F | Ht 70.0 in | Wt 183.0 lb

## 2021-10-16 DIAGNOSIS — Z23 Encounter for immunization: Secondary | ICD-10-CM

## 2021-10-16 DIAGNOSIS — N400 Enlarged prostate without lower urinary tract symptoms: Secondary | ICD-10-CM | POA: Diagnosis not present

## 2021-10-16 DIAGNOSIS — E782 Mixed hyperlipidemia: Secondary | ICD-10-CM | POA: Diagnosis not present

## 2021-10-16 DIAGNOSIS — I2510 Atherosclerotic heart disease of native coronary artery without angina pectoris: Secondary | ICD-10-CM

## 2021-10-16 DIAGNOSIS — Z0000 Encounter for general adult medical examination without abnormal findings: Secondary | ICD-10-CM

## 2021-10-16 DIAGNOSIS — Z0001 Encounter for general adult medical examination with abnormal findings: Secondary | ICD-10-CM

## 2021-10-16 DIAGNOSIS — D696 Thrombocytopenia, unspecified: Secondary | ICD-10-CM

## 2021-10-16 MED ORDER — SILDENAFIL CITRATE 50 MG PO TABS: 50.0000 mg | ORAL_TABLET | Freq: Every day | ORAL | 2 refills | Status: DC | PRN

## 2021-10-16 MED ORDER — AZELASTINE HCL 0.15 % NA SOLN: NASAL | 3 refills | Status: DC

## 2021-10-16 MED ORDER — FLUTICASONE PROPIONATE 50 MCG/ACT NA SUSP: 2.0000 | Freq: Every day | NASAL | 6 refills | Status: DC

## 2021-10-16 MED ORDER — OMEGA-3-ACID ETHYL ESTERS 1 G PO CAPS: 2.0000 g | ORAL_CAPSULE | Freq: Two times a day (BID) | ORAL | 3 refills | Status: DC

## 2021-10-16 NOTE — Progress Notes (Signed)
Kyle Andrade is a 56 y.o. male presents to office today for annual physical exam examination.    Concerns today include: 1.  Overall doing well.  He is expecting a grandson.  Will be his first grandchild  Substance use: Drinks 1 beer per night Diet: Very balanced, low in meat and high in plant-based foods, Exercise: Exercises regularly Last eye exam: Up-to-date Last dental exam: Up to date Last colonoscopy: Up-to-date Refills needed today: All Immunizations needed: Flu, shingles and tetanus but will only take flu today Immunization History  Administered Date(s) Administered   Influenza Nasal 11/05/2009   Influenza,inj,Quad PF,6+ Mos 11/02/2012, 11/08/2013, 11/14/2014, 11/15/2015, 11/12/2016, 11/03/2017, 11/24/2018, 10/11/2020, 10/16/2021   PFIZER(Purple Top)SARS-COV-2 Vaccination 04/15/2019, 05/10/2019   Pneumococcal Conjugate-13 11/14/2014   Td 11/22/2003, 10/25/2010   Tdap 10/25/2010     Past Medical History:  Diagnosis Date   Chest discomfort 03/2012   Sent to ED, had CT scan & was sent home. Mild coronary calcification in the LAD was noted on CT scan.   Coronary atherosclerosis due to calcified coronary lesion(414.4)    Hyperlipidemia    Hyperplasia of prostate    Rheumatic fever without mention of heart involvement    SOB (shortness of breath) 03/2012   Prior to CP as well   Unspecified hemorrhoids without mention of complication    Vitamin D deficiency    Social History   Socioeconomic History   Marital status: Married    Spouse name: Kendrick Fries   Number of children: Not on file   Years of education: Not on file   Highest education level: Not on file  Occupational History   Occupation: Full time    Employer: [employer]  Tobacco Use   Smoking status: Never   Smokeless tobacco: Never  Vaping Use   Vaping Use: Never used  Substance and Sexual Activity   Alcohol use: Yes    Alcohol/week: 2.0 standard drinks of alcohol    Types: 2 Cans of beer per  week   Drug use: No   Sexual activity: Not on file  Other Topics Concern   Not on file  Social History Narrative   Not on file   Social Determinants of Health   Financial Resource Strain: Not on file  Food Insecurity: Not on file  Transportation Needs: Not on file  Physical Activity: Not on file  Stress: Not on file  Social Connections: Not on file  Intimate Partner Violence: Not on file   Past Surgical History:  Procedure Laterality Date   URETHRA SURGERY  as a child   Family History  Problem Relation Age of Onset   Healthy Mother    CVA Mother    Diabetes Father    Emphysema Father    CVA Maternal Grandfather    CVA Paternal Grandfather    Colon cancer Neg Hx     Current Outpatient Medications:    aspirin 81 MG chewable tablet, Chew 81 mg by mouth daily., Disp: , Rfl:    Azelastine HCl 0.15 % SOLN, USE 1 SPRAYS INTO EACH NOSTRIL AS NEEDED FOR NASAL CONGESTION, Disp: 30 mL, Rfl: 3   Cholecalciferol (VITAMIN D) 2000 UNITS CAPS, Take 1 capsule by mouth daily., Disp: , Rfl:    fluticasone (FLONASE) 50 MCG/ACT nasal spray, Place 2 sprays into both nostrils daily., Disp: 16 g, Rfl: 6   Ibuprofen (ADVIL PO), Take by mouth., Disp: , Rfl:    omega-3 acid ethyl esters (LOVAZA) 1 g capsule, Take 2 capsules (  2 g total) by mouth 2 (two) times daily., Disp: 360 capsule, Rfl: 3   rosuvastatin (CRESTOR) 20 MG tablet, TAKE 1 TABLET DAILY, TAKE 1/2 TABLET MONDAY, WEDNESDAY, AND FRIDAY, Disp: 66 tablet, Rfl: 5   sildenafil (VIAGRA) 50 MG tablet, Take 1 tablet (50 mg total) by mouth daily as needed for erectile dysfunction (place on file)., Disp: 10 tablet, Rfl: 2  Allergies  Allergen Reactions   Simvastatin     Joint pain   Lipitor [Atorvastatin Calcium] Other (See Comments)    myalgias     ROS: Review of Systems A comprehensive review of systems was negative.    Physical exam BP 119/76   Pulse (!) 58   Temp (!) 97.4 F (36.3 C)   Ht 5\' 10"  (1.778 m)   Wt 183 lb (83 kg)    SpO2 100%   BMI 26.26 kg/m  General appearance: alert, cooperative, appears stated age, and no distress Head: Normocephalic, without obvious abnormality, atraumatic Eyes: conjunctivae/corneas clear. PERRL, EOM's intact.   Ears: normal TM's and external ear canals both ears Nose: Nares normal. Septum midline. Mucosa normal. No drainage or sinus tenderness. Throat: lips, mucosa, and tongue normal; teeth and gums normal Neck: no adenopathy, no carotid bruit, supple, symmetrical, trachea midline, and thyroid not enlarged, symmetric, no tenderness/mass/nodules Back: symmetric, no curvature. ROM normal. No CVA tenderness. Lungs: clear to auscultation bilaterally Chest wall: no tenderness Heart: regular rate and rhythm, S1, S2 normal, no murmur, click, rub or gallop Abdomen: soft, non-tender; bowel sounds normal; no masses,  no organomegaly Extremities: extremities normal, atraumatic, no cyanosis or edema Pulses: 2+ and symmetric Skin: Skin color, texture, turgor normal. No rashes or lesions Lymph nodes: Cervical, supraclavicular, and axillary nodes normal. Neurologic: Grossly normal    Assessment/ Plan: here for annual physical exam.   Annual physical exam  Need for immunization against influenza - Plan: Flu Vaccine QUAD 58mo+IM (Fluarix, Fluzone & Alfiuria Quad PF)  Mixed hyperlipidemia - Plan: omega-3 acid ethyl esters (LOVAZA) 1 g capsule  Benign prostatic hyperplasia without lower urinary tract symptoms  Coronary artery disease involving native coronary artery of native heart without angina pectoris - Plan: omega-3 acid ethyl esters (LOVAZA) 1 g capsule  Low platelet count (HCC) - Plan: CBC  Influenza vaccination administered.  He declined shingles and tetanus shot today.  He we reviewed his labs today.  I have renewed his Lovaza and he will continue the Crestor as prescribed by his heart doctor  Asymptomatic from a BPH standpoint  Uncertain etiology of the low  platelet count but I suspect this is related to the viral illness he had a couple of weeks prior to obtaining his labs.  I placed a future order for CBC to further evaluate and depending on what this shows we may need to consider further assessment.  Discussed potential etiologies of low platelet count and red flag signs and symptoms to monitor for.  Patient to follow up in 1 year for annual exam or sooner if needed.  Berlie Hatchel M. 5mo, DO

## 2021-10-16 NOTE — Patient Instructions (Signed)
Come back in 2 weeks for recheck platelet.  Think it's probably low due to recent illness but want to make sure it comes back to a normal range.

## 2021-11-02 ENCOUNTER — Other Ambulatory Visit: Payer: BC Managed Care – PPO

## 2021-11-02 DIAGNOSIS — D696 Thrombocytopenia, unspecified: Secondary | ICD-10-CM

## 2021-11-02 LAB — CBC
Hematocrit: 44 % (ref 37.5–51.0)
Hemoglobin: 15.5 g/dL (ref 13.0–17.7)
MCH: 32.4 pg (ref 26.6–33.0)
MCHC: 35.2 g/dL (ref 31.5–35.7)
MCV: 92 fL (ref 79–97)
Platelets: 140 10*3/uL — ABNORMAL LOW (ref 150–450)
RBC: 4.79 x10E6/uL (ref 4.14–5.80)
RDW: 12.1 % (ref 11.6–15.4)
WBC: 3.8 10*3/uL (ref 3.4–10.8)

## 2022-02-01 ENCOUNTER — Other Ambulatory Visit: Payer: Self-pay | Admitting: Cardiology

## 2022-02-14 NOTE — Progress Notes (Signed)
Cardiology Office Note   Date:  02/15/2022   ID:  Kyle Andrade, DOB 05/21/1965, MRN [id number]  PCP:  Janora Norlander, DO  Cardiologist:   Minus Breeding, MD   Chief Complaint  Patient presents with   Coronary Artery Disease    History of Present Illness: Kyle Andrade is a 57 y.o. male who had a CT in 2014 and was found to have elevated coronary calcium with non obstructive CAD.  He is referred by Janora Norlander, DO for evaluation of this.   I reviewed the hospital records from 2014.  He presented with chest discomfort.  They did a coronary CT.  He had a calcium score of 122 which was 93rd percentile for age.  There was a 20% LAD stenosis.   His last follow up was a negative POET (Plain Old Exercise Treadmill) in Jan 2019.  He did have some chest discomfort late last year.  I sent him for a CTA in Dec 2019.  He was found to have non obstructive disease.  He was found to have 25 to 50% mild long calcified plaque in the proximal LAD and a focal stenosis of 50 to 69% which was slightly progressed.     He returns for follow up.  Since I last saw him he has done very well.  He exercises routinely.  This includes rowing and running. The patient denies any new symptoms such as chest discomfort, neck or arm discomfort. There has been no new shortness of breath, PND or orthopnea. There have been no reported palpitations, presyncope or syncope. He just had his first grandson, [name].  Past Medical History:  Diagnosis Date   Chest discomfort 03/2012   Sent to ED, had CT scan & was sent home. Mild coronary calcification in the LAD was noted on CT scan.   Coronary atherosclerosis due to calcified coronary lesion(414.4)    Hyperlipidemia    Hyperplasia of prostate    Rheumatic fever without mention of heart involvement    SOB (shortness of breath) 03/2012   Prior to CP as well   Unspecified hemorrhoids without mention of complication    Vitamin D deficiency     Past Surgical  History:  Procedure Laterality Date   URETHRA SURGERY  as a child     Current Outpatient Medications  Medication Sig Dispense Refill   aspirin 81 MG chewable tablet Chew 81 mg by mouth daily.     Azelastine HCl 0.15 % SOLN USE 1 SPRAYS INTO EACH NOSTRIL AS NEEDED FOR NASAL Drainage 30 mL 3   Cholecalciferol (VITAMIN D) 2000 UNITS CAPS Take 1 capsule by mouth daily.     fluticasone (FLONASE) 50 MCG/ACT nasal spray Place 2 sprays into both nostrils daily. For sinus congestion 16 g 6   Ibuprofen (ADVIL PO) Take by mouth.     omega-3 acid ethyl esters (LOVAZA) 1 g capsule Take 2 capsules (2 g total) by mouth 2 (two) times daily. 360 capsule 3   rosuvastatin (CRESTOR) 20 MG tablet TAKE 1/2 TABLET ON MONDAYS, WEDNESDAYS, & FRIDAYS. TAKE 1 TABLET ALL OTHER DAYS 66 tablet 5   sildenafil (VIAGRA) 50 MG tablet Take 1 tablet (50 mg total) by mouth daily as needed for erectile dysfunction (place on file). 10 tablet 2   No current facility-administered medications for this visit.    Allergies:   Simvastatin and Lipitor [atorvastatin calcium]   ROS:  Please see the history of present illness.  Otherwise, review of systems are positive for none.   All other systems are reviewed and negative.    PHYSICAL EXAM: VS:  BP 118/74 (BP Location: Left Arm, Patient Position: Sitting, Cuff Size: Normal)   Pulse 60   Ht 5\' 10"  (1.778 m)   Wt 188 lb (85.3 kg)   BMI 26.98 kg/m  , BMI Body mass index is 26.98 kg/m. GENERAL:  Well appearing NECK:  No jugular venous distention, waveform within normal limits, carotid upstroke brisk and symmetric, no bruits, no thyromegaly LUNGS:  Clear to auscultation bilaterally CHEST:  Unremarkable HEART:  PMI not displaced or sustained,S1 and S2 within normal limits, no S3, no S4, no clicks, no rubs, no murmurs ABD:  Flat, positive bowel sounds normal in frequency in pitch, no bruits, no rebound, no guarding, no midline pulsatile mass, no hepatomegaly, no splenomegaly EXT:   2 plus pulses throughout, no edema, no cyanosis no clubbing   EKG:  EKG is  ordered today. Normal sinus rhythm, rate 60, axis within normal limits, intervals within normal limits, no acute ST-T wave changes.  Recent Labs: 10/12/2021: ALT 22; BUN 10; Creatinine, Ser 1.16; Potassium 4.8; Sodium 144 11/02/2021: Hemoglobin 15.5; Platelets 140    Lipid Panel    Component Value Date/Time   CHOL 115 10/12/2021 0808   CHOL 118 05/04/2012 0914   TRIG 56 10/12/2021 0808   TRIG 77 11/26/2016 0935   TRIG 80 05/04/2012 0914   HDL 42 10/12/2021 0808   HDL 38 (L) 11/26/2016 0935   HDL 37 (L) 05/04/2012 0914   CHOLHDL 2.7 10/12/2021 0808   LDLCALC 60 10/12/2021 0808   LDLCALC 77 11/08/2013 0852   LDLCALC 65 05/04/2012 0914      Wt Readings from Last 3 Encounters:  02/15/22 188 lb (85.3 kg)  10/16/21 183 lb (83 kg)  02/13/21 185 lb 9.6 oz (84.2 kg)      Other studies Reviewed: Additional studies/ records that were reviewed today include: Labs Review of the above records demonstrates:   See elsewhere   ASSESSMENT AND PLAN:  CAD:   The patient has no new sypmtoms.  No further cardiovascular testing is indicated.  We will continue with aggressive risk reduction and meds as listed.  DYSLIPIDEMIA:    LDL was 60.  HDL 42.  I will check an LP(a).  He will continue current dose of statin.    Current medicines are reviewed at length with the patient today.  The patient does not have concerns regarding medicines.  The following changes have been made:   None  Labs/ tests ordered today include:    Orders Placed This Encounter  Procedures   Lipoprotein A (LPA)   EKG 12-Lead    Disposition:   FU with me in 12 months.   Signed, Minus Breeding, MD  02/15/2022 1:50 PM    [facility]

## 2022-02-15 ENCOUNTER — Ambulatory Visit: Payer: BC Managed Care – PPO | Attending: Cardiology | Admitting: Cardiology

## 2022-02-15 ENCOUNTER — Encounter: Payer: Self-pay | Admitting: Cardiology

## 2022-02-15 VITALS — BP 118/74 | HR 60 | Ht 70.0 in | Wt 188.0 lb

## 2022-02-15 DIAGNOSIS — I2510 Atherosclerotic heart disease of native coronary artery without angina pectoris: Secondary | ICD-10-CM

## 2022-02-15 DIAGNOSIS — E785 Hyperlipidemia, unspecified: Secondary | ICD-10-CM | POA: Diagnosis not present

## 2022-02-15 NOTE — Patient Instructions (Signed)
Medication Instructions:  Your physician recommends that you continue on your current medications as directed. Please refer to the Current Medication list given to you today.  *If you need a refill on your cardiac medications before your next appointment, please call your pharmacy*   Lab Work: Your physician recommends that you have the following lab drawn today: LP(a)  If you have labs (blood work) drawn today and your tests are completely normal, you will receive your results only by: [place] (if you have MyChart) OR A paper copy in the mail If you have any lab test that is abnormal or we need to change your treatment, we will call you to review the results.   Testing/Procedures: NONE   Follow-Up: At [facility], you and your health needs are our priority.  As part of our continuing mission to provide you with exceptional heart care, we have created designated Provider Care Teams.  These Care Teams include your primary Cardiologist (physician) and Advanced Practice Providers (APPs -  Physician Assistants and Nurse Practitioners) who all work together to provide you with the care you need, when you need it.  We recommend signing up for the patient portal called "MyChart".  Sign up information is provided on this After Visit Summary.  MyChart is used to connect with patients for Virtual Visits (Telemedicine).  Patients are able to view lab/test results, encounter notes, upcoming appointments, etc.  Non-urgent messages can be sent to your provider as well.   To learn more about what you can do with MyChart, go to [email or web address].    Your next appointment:   1 year(s)  Provider:   Minus Breeding, MD

## 2022-02-19 LAB — LIPOPROTEIN A (LPA): Lipoprotein (a): 189.6 nmol/L — ABNORMAL HIGH (ref <75.0)

## 2022-10-18 ENCOUNTER — Encounter: Payer: BC Managed Care – PPO | Admitting: Family Medicine

## 2022-11-25 ENCOUNTER — Ambulatory Visit (INDEPENDENT_AMBULATORY_CARE_PROVIDER_SITE_OTHER): Payer: BC Managed Care – PPO

## 2022-11-25 DIAGNOSIS — Z23 Encounter for immunization: Secondary | ICD-10-CM | POA: Diagnosis not present

## 2023-01-01 ENCOUNTER — Other Ambulatory Visit: Payer: Self-pay | Admitting: Family Medicine

## 2023-02-07 ENCOUNTER — Other Ambulatory Visit: Payer: Self-pay | Admitting: Cardiology

## 2023-02-11 ENCOUNTER — Ambulatory Visit: Payer: BC Managed Care – PPO | Admitting: Cardiology

## 2023-02-17 ENCOUNTER — Telehealth: Payer: Self-pay | Admitting: Family Medicine

## 2023-02-17 ENCOUNTER — Other Ambulatory Visit: Payer: Self-pay

## 2023-02-17 DIAGNOSIS — E782 Mixed hyperlipidemia: Secondary | ICD-10-CM

## 2023-02-17 DIAGNOSIS — D696 Thrombocytopenia, unspecified: Secondary | ICD-10-CM

## 2023-02-17 DIAGNOSIS — I2510 Atherosclerotic heart disease of native coronary artery without angina pectoris: Secondary | ICD-10-CM

## 2023-02-17 DIAGNOSIS — N400 Enlarged prostate without lower urinary tract symptoms: Secondary | ICD-10-CM

## 2023-02-17 NOTE — Telephone Encounter (Signed)
Labs pended and sent to provider.

## 2023-02-25 ENCOUNTER — Other Ambulatory Visit: Payer: Self-pay

## 2023-02-26 ENCOUNTER — Telehealth: Payer: Self-pay

## 2023-02-26 NOTE — Telephone Encounter (Signed)
 Appt scheduled with DOD 02/27/23, PT Stated he may go to Urgent Care and will call back if he does to cancel appt

## 2023-02-26 NOTE — Telephone Encounter (Signed)
 Copied from CRM [id number]. Topic: Appointments - Scheduling Inquiry for Clinic >> Feb 26, 2023  7:40 AM Powell B wrote: Reason for CRM: Patient wife calling because patient has had sore throat and coughing x2 days. Could not find acute appointment, would like to speak with nurse or PCP. [phone]

## 2023-02-26 NOTE — Telephone Encounter (Signed)
 [email or web address]

## 2023-02-27 ENCOUNTER — Ambulatory Visit: Payer: 59

## 2023-03-05 ENCOUNTER — Encounter: Payer: Self-pay | Admitting: Family Medicine

## 2023-03-05 ENCOUNTER — Ambulatory Visit (INDEPENDENT_AMBULATORY_CARE_PROVIDER_SITE_OTHER): Payer: 59 | Admitting: Family Medicine

## 2023-03-05 VITALS — BP 117/70 | HR 68 | Temp 98.8°F | Ht 70.0 in | Wt 186.6 lb

## 2023-03-05 DIAGNOSIS — N400 Enlarged prostate without lower urinary tract symptoms: Secondary | ICD-10-CM

## 2023-03-05 DIAGNOSIS — E782 Mixed hyperlipidemia: Secondary | ICD-10-CM | POA: Diagnosis not present

## 2023-03-05 DIAGNOSIS — Z23 Encounter for immunization: Secondary | ICD-10-CM | POA: Diagnosis not present

## 2023-03-05 DIAGNOSIS — Z0001 Encounter for general adult medical examination with abnormal findings: Secondary | ICD-10-CM

## 2023-03-05 DIAGNOSIS — N5201 Erectile dysfunction due to arterial insufficiency: Secondary | ICD-10-CM

## 2023-03-05 DIAGNOSIS — I2510 Atherosclerotic heart disease of native coronary artery without angina pectoris: Secondary | ICD-10-CM | POA: Diagnosis not present

## 2023-03-05 DIAGNOSIS — J301 Allergic rhinitis due to pollen: Secondary | ICD-10-CM

## 2023-03-05 DIAGNOSIS — D696 Thrombocytopenia, unspecified: Secondary | ICD-10-CM

## 2023-03-05 DIAGNOSIS — Z0000 Encounter for general adult medical examination without abnormal findings: Secondary | ICD-10-CM

## 2023-03-05 MED ORDER — FLUTICASONE PROPIONATE 50 MCG/ACT NA SUSP: 2.0000 | Freq: Every day | NASAL | 6 refills | Status: AC

## 2023-03-05 MED ORDER — ROSUVASTATIN CALCIUM 20 MG PO TABS: ORAL_TABLET | ORAL | 3 refills | Status: DC

## 2023-03-05 MED ORDER — AZELASTINE HCL 0.15 % NA SOLN: NASAL | 3 refills | Status: AC

## 2023-03-05 MED ORDER — OMEGA-3-ACID ETHYL ESTERS 1 G PO CAPS: 2.0000 g | ORAL_CAPSULE | Freq: Two times a day (BID) | ORAL | 3 refills | Status: AC

## 2023-03-05 MED ORDER — TADALAFIL 20 MG PO TABS: 10.0000 mg | ORAL_TABLET | ORAL | 99 refills | Status: AC | PRN

## 2023-03-05 NOTE — Progress Notes (Signed)
Kyle Andrade is a 58 y.o. male presents to office today for annual physical exam examination.    Concerns today include: 1.  Recently had the flu and strep and is almost done with antibiotics for strep.  He otherwise is doing really well.  Continues to exercise daily and golf 4 to 5 days/week.  He maintains a balanced diet and is compliant with all medications.  He is a non-smoker  Marital status: Married to [employer], Substance use: Rare EtOH Health Maintenance Due  Topic Date Due   Zoster Vaccines- Shingrix (1 of 2) Never done   Pneumococcal Vaccine 73-87 Years old (2 of 2 - PPSV23 or PCV20) 01/09/2015   DTaP/Tdap/Td (4 - Td or Tdap) 10/24/2020   COVID-19 Vaccine (3 - 2024-25 season) 09/22/2022   Refills needed today: all  Immunization History  Administered Date(s) Administered   Influenza Nasal 11/05/2009   Influenza, Seasonal, Injecte, Preservative Fre 11/25/2022   Influenza,inj,Quad PF,6+ Mos 11/02/2012, 11/08/2013, 11/14/2014, 11/15/2015, 11/12/2016, 11/03/2017, 11/24/2018, 10/11/2020, 10/16/2021   PFIZER(Purple Top)SARS-COV-2 Vaccination 04/15/2019, 05/10/2019   Pneumococcal Conjugate-13 11/14/2014   Td 11/22/2003, 10/25/2010   Tdap 10/25/2010   Past Medical History:  Diagnosis Date   Chest discomfort 03/2012   Sent to ED, had CT scan & was sent home. Mild coronary calcification in the LAD was noted on CT scan.   Coronary atherosclerosis due to calcified coronary lesion(414.4)    Hyperlipidemia    Hyperplasia of prostate    Rheumatic fever without mention of heart involvement    SOB (shortness of breath) 03/2012   Prior to CP as well   Unspecified hemorrhoids without mention of complication    Vitamin D deficiency    Social History   Socioeconomic History   Marital status: Married    Spouse name: [name]   Number of children: Not on file   Years of education: Not on file   Highest education level: Not on file  Occupational History   Occupation: Full time     Employer: [employer]  Tobacco Use   Smoking status: Never   Smokeless tobacco: Never  Vaping Use   Vaping status: Never Used  Substance and Sexual Activity   Alcohol use: Yes    Alcohol/week: 2.0 standard drinks of alcohol    Types: 2 Cans of beer per week   Drug use: No   Sexual activity: Not on file  Other Topics Concern   Not on file  Social History Narrative   Not on file   Social Drivers of Health   Financial Resource Strain: Not on file  Food Insecurity: Not on file  Transportation Needs: Not on file  Physical Activity: Not on file  Stress: Not on file  Social Connections: Not on file  Intimate Partner Violence: Not on file   Past Surgical History:  Procedure Laterality Date   URETHRA SURGERY  as a child   Family History  Problem Relation Age of Onset   Healthy Mother    CVA Mother    Diabetes Father    Emphysema Father    CVA Maternal Grandfather    CVA Paternal Grandfather    Colon cancer Neg Hx     Current Outpatient Medications:    aspirin 81 MG chewable tablet, Chew 81 mg by mouth daily., Disp: , Rfl:    Azelastine HCl 0.15 % SOLN, USE 1 SPRAYS INTO EACH NOSTRIL AS NEEDED FOR NASAL Drainage, Disp: 30 mL, Rfl: 3   Cholecalciferol (VITAMIN D) 2000 UNITS  CAPS, Take 1 capsule by mouth daily., Disp: , Rfl:    fluticasone (FLONASE) 50 MCG/ACT nasal spray, 2 SPRAYS IN EACH NOSTRIL DAILY FOR SINUS CONGESTION, Disp: 16 g, Rfl: 6   Ibuprofen (ADVIL PO), Take by mouth., Disp: , Rfl:    omega-3 acid ethyl esters (LOVAZA) 1 g capsule, Take 2 capsules (2 g total) by mouth 2 (two) times daily., Disp: 360 capsule, Rfl: 3   rosuvastatin (CRESTOR) 20 MG tablet, TAKE 1/2 TABLET ON MONDAYS, WEDNESDAYS, & FRIDAYS. TAKE 1 TABLET ALL OTHER DAYS, Disp: 75 tablet, Rfl: 0   sildenafil (VIAGRA) 50 MG tablet, Take 1 tablet (50 mg total) by mouth daily as needed for erectile dysfunction (place on file)., Disp: 10 tablet, Rfl: 2  Allergies  Allergen Reactions   Simvastatin      Joint pain   Lipitor [Atorvastatin Calcium] Other (See Comments)    myalgias     ROS: Review of Systems Neuro: pos for headaches with viagra. Hasn't tried alternatives.    Physical exam BP 117/70   Pulse 68   Temp 98.8 F (37.1 C)   Ht 5\' 10"  (1.778 m)   Wt 186 lb 9.6 oz (84.6 kg)   SpO2 97%   BMI 26.77 kg/m  General appearance: alert, cooperative, appears stated age, and no distress Head: Normocephalic, without obvious abnormality, atraumatic Eyes: negative findings: lids and lashes normal, conjunctivae and sclerae normal, corneas clear, and pupils equal, round, reactive to light and accomodation Ears: normal TM's and external ear canals both ears Nose: Nares normal. Septum midline. Mucosa normal. No drainage or sinus tenderness. Throat: lips, mucosa, and tongue normal; teeth and gums normal Neck: no adenopathy, no carotid bruit, supple, symmetrical, trachea midline, and thyroid not enlarged, symmetric, no tenderness/mass/nodules Back: symmetric, no curvature. ROM normal. No CVA tenderness. Lungs: clear to auscultation bilaterally Chest wall: no tenderness Heart: regular rate and rhythm, S1, S2 normal, no murmur, click, rub or gallop Abdomen: soft, non-tender; bowel sounds normal; no masses,  no organomegaly Extremities: extremities normal, atraumatic, no cyanosis or edema Pulses: 2+ and symmetric Skin: Skin color, texture, turgor normal. No rashes or lesions Neurologic: Grossly normal      03/05/2023    9:02 AM 10/16/2021    1:46 PM 10/11/2020    2:00 PM  Depression screen PHQ 2/9  Decreased Interest 0 0 0  Down, Depressed, Hopeless 0 0 0  PHQ - 2 Score 0 0 0  Altered sleeping 0    Tired, decreased energy 0    Change in appetite 0    Feeling bad or failure about yourself  0    Trouble concentrating 0    Moving slowly or fidgety/restless 0    Suicidal thoughts 0    PHQ-9 Score 0    Difficult doing work/chores Not difficult at all        03/05/2023    9:02 AM  10/16/2021    1:46 PM 10/11/2020    2:00 PM  GAD 7 : Generalized Anxiety Score  Nervous, Anxious, on Edge 0 0 0  Control/stop worrying 0 0 0  Worry too much - different things 0 0 0  Trouble relaxing 0 0 0  Restless 0 0 0  Easily annoyed or irritable 0 0 0  Afraid - awful might happen 0 0 0  Total GAD 7 Score 0 0 0  Anxiety Difficulty  Not difficult at all Not difficult at all     Assessment/ Plan: [name] here for annual  physical exam.   Annual physical exam  Mixed hyperlipidemia - Plan: omega-3 acid ethyl esters (LOVAZA) 1 g capsule  Coronary artery disease involving native coronary artery of native heart without angina pectoris - Plan: omega-3 acid ethyl esters (LOVAZA) 1 g capsule, rosuvastatin (CRESTOR) 20 MG tablet  Benign prostatic hyperplasia without lower urinary tract symptoms  Low platelet count (HCC)  Erectile dysfunction due to arterial insufficiency - Plan: tadalafil (CIALIS) 20 MG tablet  Seasonal allergic rhinitis due to pollen - Plan: Azelastine HCl 0.15 % SOLN, fluticasone (FLONASE) 50 MCG/ACT nasal spray  Has future orders in for fasting labs and will return at a later date to have these done.  I have renewed his medications.  Will CC lipid panel to cardiology as FYI  BPH is chronic and stable.  PSA is ordered  CBC will be collected along with other labs  Stop Viagra, trial of Cialis.  He will message me on MyChart if he has any complications with this replacement  Counseled on healthy lifestyle choices, including diet (rich in fruits, vegetables and lean meats and low in salt and simple carbohydrates) and exercise (at least 30 minutes of moderate physical activity daily).  Patient to follow up 1 year for CPE  Beuford Garcilazo M. Nadine Counts, DO

## 2023-03-19 ENCOUNTER — Other Ambulatory Visit: Payer: 59

## 2023-03-19 DIAGNOSIS — E782 Mixed hyperlipidemia: Secondary | ICD-10-CM

## 2023-03-19 DIAGNOSIS — D696 Thrombocytopenia, unspecified: Secondary | ICD-10-CM

## 2023-03-19 DIAGNOSIS — I2510 Atherosclerotic heart disease of native coronary artery without angina pectoris: Secondary | ICD-10-CM

## 2023-03-19 DIAGNOSIS — N400 Enlarged prostate without lower urinary tract symptoms: Secondary | ICD-10-CM

## 2023-03-20 ENCOUNTER — Encounter: Payer: Self-pay | Admitting: Family Medicine

## 2023-03-20 LAB — COMPREHENSIVE METABOLIC PANEL
ALT: 22 [IU]/L (ref 0–44)
AST: 9 [IU]/L (ref 0–40)
Albumin: 4.2 g/dL (ref 3.8–4.9)
Alkaline Phosphatase: 65 [IU]/L (ref 44–121)
BUN/Creatinine Ratio: 13 (ref 9–20)
BUN: 15 mg/dL (ref 6–24)
Bilirubin Total: 0.7 mg/dL (ref 0.0–1.2)
CO2: 24 mmol/L (ref 20–29)
Calcium: 9.3 mg/dL (ref 8.7–10.2)
Chloride: 107 mmol/L — ABNORMAL HIGH (ref 96–106)
Creatinine, Ser: 1.14 mg/dL (ref 0.76–1.27)
Globulin, Total: 2.2 g/dL (ref 1.5–4.5)
Glucose: 91 mg/dL (ref 70–99)
Potassium: 5.1 mmol/L (ref 3.5–5.2)
Sodium: 143 mmol/L (ref 134–144)
Total Protein: 6.4 g/dL (ref 6.0–8.5)
eGFR: 74 mL/min/{1.73_m2} (ref >59)

## 2023-03-20 LAB — PSA: Prostate Specific Ag, Serum: 1.1 ng/mL (ref 0.0–4.0)

## 2023-03-20 LAB — CBC WITH DIFFERENTIAL/PLATELET
Basophils Absolute: 0 10*3/uL (ref 0.0–0.2)
Basos: 1 %
EOS (ABSOLUTE): 0.1 10*3/uL (ref 0.0–0.4)
Eos: 2 %
Hematocrit: 43.3 % (ref 37.5–51.0)
Hemoglobin: 14.4 g/dL (ref 13.0–17.7)
Immature Grans (Abs): 0 10*3/uL (ref 0.0–0.1)
Immature Granulocytes: 0 %
Lymphocytes Absolute: 1.3 10*3/uL (ref 0.7–3.1)
Lymphs: 34 %
MCH: 31.6 pg (ref 26.6–33.0)
MCHC: 33.3 g/dL (ref 31.5–35.7)
MCV: 95 fL (ref 79–97)
Monocytes Absolute: 0.4 10*3/uL (ref 0.1–0.9)
Monocytes: 11 %
Neutrophils Absolute: 1.9 10*3/uL (ref 1.4–7.0)
Neutrophils: 52 %
Platelets: 148 10*3/uL — ABNORMAL LOW (ref 150–450)
RBC: 4.56 x10E6/uL (ref 4.14–5.80)
RDW: 12.4 % (ref 11.6–15.4)
WBC: 3.6 10*3/uL (ref 3.4–10.8)

## 2023-03-20 LAB — LIPID PANEL
Chol/HDL Ratio: 3.4 {ratio} (ref 0.0–5.0)
Cholesterol, Total: 134 mg/dL (ref 100–199)
HDL: 39 mg/dL — ABNORMAL LOW (ref >39)
LDL Chol Calc (NIH): 80 mg/dL (ref 0–99)
Triglycerides: 78 mg/dL (ref 0–149)
VLDL Cholesterol Cal: 15 mg/dL (ref 5–40)

## 2023-03-20 LAB — TSH: TSH: 2.76 u[IU]/mL (ref 0.450–4.500)

## 2023-04-13 NOTE — Progress Notes (Unsigned)
 Cardiology Office Note:   Date:  04/14/2023  ID:  Kyle Andrade, DOB Dec 06, 1965, MRN [id number] PCP: Raliegh Ip, DO  [facility] Providers Cardiologist:  Rollene Rotunda, MD {  History of Present Illness:   Kyle Andrade is a 58 y.o. male  who had a CT in 2014 and was found to have elevated coronary calcium with non obstructive CAD.  He is referred by Raliegh Ip, DO for evaluation of this.   I reviewed the hospital records from 2014.  He presented with chest discomfort.  They did a coronary CT.  He had a calcium score of 122 which was 93rd percentile for age.  There was a 20% LAD stenosis.   His last follow up was a negative POET (Plain Old Exercise Treadmill) in Jan 2019.  He did have some chest discomfort late last year.  I sent him for a CTA in Dec 2019.  He was found to have non obstructive disease.  He was found to have 25 to 50% mild long calcified plaque in the proximal LAD and a focal stenosis of 50 to 69% which was slightly progressed.      He returns for follow up.  Since I last saw him he has had no new cardiovascular problems.  The patient denies any new symptoms such as chest discomfort, neck or arm discomfort. There has been no new shortness of breath, PND or orthopnea. There have been no reported palpitations, presyncope or syncope.  He is rowing and running a mile.  He plays with his 52 month old grandson Chiropodist.    ROS: As stated in the HPI and negative for all other systems.  Studies Reviewed:    EKG:   EKG Interpretation Date/Time:  Monday April 14 2023 11:54:10 EDT Ventricular Rate:  59 PR Interval:  150 QRS Duration:  96 QT Interval:  398 QTC Calculation: 394 R Axis:   67  Text Interpretation: Sinus bradycardia with occasional Premature ventricular complexes RSR' or QR pattern in V1 suggests right ventricular conduction delay Confirmed by Rollene Rotunda ([id number]) on 04/14/2023 12:00:43 PM   Risk Assessment/Calculations:         Physical  Exam:   VS:  BP 120/72   Pulse (!) 59   Ht 5\' 10"  (1.778 m)   Wt 188 lb (85.3 kg)   BMI 26.98 kg/m    Wt Readings from Last 3 Encounters:  04/14/23 188 lb (85.3 kg)  03/05/23 186 lb 9.6 oz (84.6 kg)  02/15/22 188 lb (85.3 kg)     GEN: Well nourished, well developed in no acute distress NECK: No JVD; No carotid bruits CARDIAC: RRR, no murmurs, rubs, gallops RESPIRATORY:  Clear to auscultation without rales, wheezing or rhonchi  ABDOMEN: Soft, non-tender, non-distended EXTREMITIES:  No edema; No deformity   ASSESSMENT AND PLAN:   CAD:  The patient has no new sypmtoms.  No further cardiovascular testing is indicated.  We will continue with aggressive risk reduction and meds as listed.   DYSLIPIDEMIA:    LDL was 80 which was higher than previous.   I would like the LDL to be in the 50s.  He is going to start taking his Crestor 20 mg every day and get another lipid profile in 3 months.  If he does not tolerate this given the fact that he has not tolerated simvastatin and atorvastatin I would most likely start him on a PCSK9 inhibitor.   LP(a) has been elevated but he does  not qualify for any of our clinical trials.   Follow up with me in one year.   Signed, Rollene Rotunda, MD

## 2023-04-14 ENCOUNTER — Ambulatory Visit: Payer: Self-pay | Attending: Cardiology | Admitting: Cardiology

## 2023-04-14 ENCOUNTER — Encounter: Payer: Self-pay | Admitting: Cardiology

## 2023-04-14 VITALS — BP 120/72 | HR 59 | Ht 70.0 in | Wt 188.0 lb

## 2023-04-14 DIAGNOSIS — I2510 Atherosclerotic heart disease of native coronary artery without angina pectoris: Secondary | ICD-10-CM

## 2023-04-14 DIAGNOSIS — E785 Hyperlipidemia, unspecified: Secondary | ICD-10-CM | POA: Diagnosis not present

## 2023-04-14 MED ORDER — ROSUVASTATIN CALCIUM 20 MG PO TABS: 20.0000 mg | ORAL_TABLET | Freq: Every day | ORAL | 3 refills | Status: DC

## 2023-04-14 NOTE — Patient Instructions (Signed)
 Medication Instructions:  Rosevastatin 20 mg by mouth daily. New script sent.  *If you need a refill on your cardiac medications before your next appointment, please call your pharmacy*   Lab Work: Fasting lipid profile in 3 months.  If you have labs (blood work) drawn today and your tests are completely normal, you will receive your results only by: MyChart Message (if you have MyChart) OR A paper copy in the mail If you have any lab test that is abnormal or we need to change your treatment, we will call you to review the results.   Follow-Up: At [facility], you and your health needs are our priority.  As part of our continuing mission to provide you with exceptional heart care, we have created designated Provider Care Teams.  These Care Teams include your primary Cardiologist (physician) and Advanced Practice Providers (APPs -  Physician Assistants and Nurse Practitioners) who all work together to provide you with the care you need, when you need it.  We recommend signing up for the patient portal called "MyChart".  Sign up information is provided on this After Visit Summary.  MyChart is used to connect with patients for Virtual Visits (Telemedicine).  Patients are able to view lab/test results, encounter notes, upcoming appointments, etc.  Non-urgent messages can be sent to your provider as well.   To learn more about what you can do with MyChart, go to [email or web address].    Your next appointment:   1 year(s)  Provider:   Rollene Rotunda, MD     Other Instructions

## 2023-06-09 ENCOUNTER — Other Ambulatory Visit (HOSPITAL_COMMUNITY): Payer: Self-pay

## 2023-06-09 ENCOUNTER — Telehealth: Payer: Self-pay

## 2023-06-09 NOTE — Telephone Encounter (Signed)
 Pharmacy Patient Advocate Encounter   Received notification from CoverMyMeds that prior authorization for  Omega-3-acid  Ethyl Esters 1GM capsules is required/requested.   Insurance verification completed.   The patient is insured through [facility] .   Per test claim: PA required; PA submitted to above mentioned insurance via CoverMyMeds Key/confirmation #/EOC [facility] Status is pending

## 2023-06-09 NOTE — Telephone Encounter (Signed)
 Pharmacy Patient Advocate Encounter  Received notification from [employer] [facility] that Prior Authorization for Omega-3-acid  Ethyl Esters 1GM capsules has been APPROVED from 06/09/23 to 06/09/26. Ran test claim, Copay is $15.00/ 90 DAYS. This test claim was processed through [facility]- copay amounts may vary at other pharmacies due to pharmacy/plan contracts, or as the patient moves through the different stages of their insurance plan.   PA #/Case ID/Reference #: [id number]

## 2023-07-04 ENCOUNTER — Ambulatory Visit: Attending: Cardiology

## 2023-07-04 ENCOUNTER — Telehealth: Payer: Self-pay | Admitting: Cardiology

## 2023-07-04 DIAGNOSIS — R002 Palpitations: Secondary | ICD-10-CM

## 2023-07-04 NOTE — Telephone Encounter (Signed)
 Spoke with pt and advised per Dr Hochrein Zio monitor ordered for 14 day wear.  Instructions sent through pt's MyCHart.  Pt verbalizes understanding and agrees with current plan.

## 2023-07-04 NOTE — Telephone Encounter (Signed)
 Patient c/o Palpitations: STAT if patient c/o lightheadedness, shortness of breath, or chest pain  How long have you had palpitations/irregular HR/ Afib? Are you having the symptoms now? Since medication change in March   Are you currently experiencing lightheadedness, SOB or CP? No  Do you have a history of afib (atrial fibrillation) or irregular heart rhythm? No   Have you checked your BP or HR? (document readings if available): Coming and going   Are you experiencing any other symptoms? No

## 2023-07-04 NOTE — Telephone Encounter (Signed)
 Spoke with pt who complains of heart palpitations greater over the past month especially at night.  Pt denies current CP, SOB or dizziness.  He has decreased his caffeine consumption back to 2 beverages daily and has increased his water intake.  He reports he has recently been under a greater amount of stress.  Continues to work out regularly.  Heart rate routinely in the 60's when he checks it.  Does not monitor BP regularly. Pt advised will forward information to Dr Lavonne Prairie and his RN for review and further recommendation.  Pt verbalizes understanding and agrees with current plan.

## 2023-07-04 NOTE — Progress Notes (Unsigned)
 Enrolled patient for a 14 day Zio XT  monitor to be mailed to patients home

## 2023-08-01 ENCOUNTER — Other Ambulatory Visit: Payer: Self-pay | Admitting: *Deleted

## 2023-08-01 DIAGNOSIS — E785 Hyperlipidemia, unspecified: Secondary | ICD-10-CM

## 2023-08-02 ENCOUNTER — Ambulatory Visit: Payer: Self-pay | Admitting: Cardiology

## 2023-08-02 DIAGNOSIS — E785 Hyperlipidemia, unspecified: Secondary | ICD-10-CM

## 2023-08-02 LAB — LIPID PANEL
Chol/HDL Ratio: 3.3 ratio (ref 0.0–5.0)
Cholesterol, Total: 126 mg/dL (ref 100–199)
HDL: 38 mg/dL — ABNORMAL LOW (ref >39)
LDL Chol Calc (NIH): 71 mg/dL (ref 0–99)
Triglycerides: 89 mg/dL (ref 0–149)
VLDL Cholesterol Cal: 17 mg/dL (ref 5–40)

## 2023-08-11 MED ORDER — ROSUVASTATIN CALCIUM 40 MG PO TABS: 40.0000 mg | ORAL_TABLET | Freq: Every day | ORAL | 3 refills | Status: AC

## 2023-08-11 NOTE — Telephone Encounter (Signed)
 Spoke with pt regarding Dr. Denver suggestions. Crestor  increased and labs ordered and released. Pt verbalized understanding. All questions if any were answered.

## 2023-08-11 NOTE — Telephone Encounter (Signed)
-----   Message from Lynwood Schilling sent at 08/02/2023 11:09 AM EDT ----- His LDL is not quite at target.  I would like to increase his Crestor  to 40 mg PO daily.  Repeat a lipid profile in 3 months.  Call Mr. Karim with the results and send results to Jolinda Norene HERO, DO ----- Message ----- From: Rebecka Memos Lab Results In Sent: 08/02/2023   1:35 AM EDT To: Lynwood Schilling, MD

## 2023-08-22 LAB — LIPID PANEL
Chol/HDL Ratio: 3.4 ratio (ref 0.0–5.0)
Cholesterol, Total: 132 mg/dL (ref 100–199)
HDL: 39 mg/dL — ABNORMAL LOW (ref >39)
LDL Chol Calc (NIH): 76 mg/dL (ref 0–99)
Triglycerides: 91 mg/dL (ref 0–149)
VLDL Cholesterol Cal: 17 mg/dL (ref 5–40)

## 2023-11-20 ENCOUNTER — Ambulatory Visit (INDEPENDENT_AMBULATORY_CARE_PROVIDER_SITE_OTHER)

## 2023-11-20 DIAGNOSIS — Z23 Encounter for immunization: Secondary | ICD-10-CM

## 2024-03-16 ENCOUNTER — Encounter: Payer: 59 | Admitting: Family Medicine
